# Patient Record
Sex: Male | Born: 1970
Health system: Southern US, Community
[De-identification: ages and names within clinical notes are randomized; demographics above are authoritative.]

## PROBLEM LIST (undated history)

## (undated) DIAGNOSIS — I10 Essential (primary) hypertension: Secondary | ICD-10-CM

## (undated) DIAGNOSIS — T7840XA Allergy, unspecified, initial encounter: Secondary | ICD-10-CM

## (undated) DIAGNOSIS — Z91018 Allergy to other foods: Secondary | ICD-10-CM

## (undated) HISTORY — PX: TONSILLECTOMY: SUR1361

## (undated) HISTORY — DX: Allergy to other foods: Z91.018

## (undated) HISTORY — DX: Allergy, unspecified, initial encounter: T78.40XA

## (undated) HISTORY — PX: TYMPANOSTOMY TUBE PLACEMENT: SHX32

---

## 2002-04-09 ENCOUNTER — Ambulatory Visit (HOSPITAL_COMMUNITY): Admission: RE | Admit: 2002-04-09 | Discharge: 2002-04-09 | Payer: Self-pay | Admitting: Family Medicine

## 2002-04-09 ENCOUNTER — Encounter: Payer: Self-pay | Admitting: Family Medicine

## 2005-11-29 ENCOUNTER — Ambulatory Visit (HOSPITAL_COMMUNITY): Admission: RE | Admit: 2005-11-29 | Discharge: 2005-11-29 | Payer: Self-pay | Admitting: Family Medicine

## 2008-01-04 ENCOUNTER — Ambulatory Visit (HOSPITAL_COMMUNITY): Admission: RE | Admit: 2008-01-04 | Discharge: 2008-01-04 | Payer: Self-pay | Admitting: Family Medicine

## 2008-02-19 ENCOUNTER — Ambulatory Visit (HOSPITAL_COMMUNITY): Admission: RE | Admit: 2008-02-19 | Discharge: 2008-02-19 | Payer: Self-pay | Admitting: Orthopaedic Surgery

## 2012-11-29 ENCOUNTER — Emergency Department (HOSPITAL_COMMUNITY): Payer: Self-pay

## 2012-11-29 ENCOUNTER — Encounter (HOSPITAL_COMMUNITY): Payer: Self-pay | Admitting: *Deleted

## 2012-11-29 ENCOUNTER — Emergency Department (HOSPITAL_COMMUNITY)
Admission: EM | Admit: 2012-11-29 | Discharge: 2012-11-30 | Disposition: A | Discharge status: Home / Self Care | Payer: Self-pay | Attending: Emergency Medicine | Admitting: Emergency Medicine

## 2012-11-29 DIAGNOSIS — Y93G9 Activity, other involving cooking and grilling: Secondary | ICD-10-CM | POA: Insufficient documentation

## 2012-11-29 DIAGNOSIS — IMO0002 Reserved for concepts with insufficient information to code with codable children: Secondary | ICD-10-CM | POA: Insufficient documentation

## 2012-11-29 DIAGNOSIS — Y929 Unspecified place or not applicable: Secondary | ICD-10-CM | POA: Insufficient documentation

## 2012-11-29 DIAGNOSIS — S68620A Partial traumatic transphalangeal amputation of right index finger, initial encounter: Secondary | ICD-10-CM

## 2012-11-29 DIAGNOSIS — W230XXA Caught, crushed, jammed, or pinched between moving objects, initial encounter: Secondary | ICD-10-CM | POA: Insufficient documentation

## 2012-11-29 HISTORY — DX: Essential (primary) hypertension: I10

## 2012-11-29 MED ORDER — LIDOCAINE HCL (PF) 1 % IJ SOLN
5.0000 mL | Freq: Once | INTRAMUSCULAR | Status: AC
  Administered 2012-11-29: 5 mL via INTRADERMAL

## 2012-11-29 MED ORDER — TRAMADOL HCL 50 MG PO TABS
100.0000 mg | ORAL_TABLET | Freq: Once | ORAL | Status: AC
  Administered 2012-11-29: 100 mg via ORAL
  Filled 2012-11-29: qty 2

## 2012-11-29 MED ORDER — CEPHALEXIN 500 MG PO CAPS
1000.0000 mg | ORAL_CAPSULE | Freq: Once | ORAL | Status: AC
Start: 2012-11-29 — End: 2012-11-29
  Administered 2012-11-29: 1000 mg via ORAL
  Filled 2012-11-29: qty 2

## 2012-11-29 MED ORDER — LIDOCAINE HCL (PF) 1 % IJ SOLN
INTRAMUSCULAR | Status: AC
  Administered 2012-11-29: 5 mL via INTRADERMAL
  Filled 2012-11-29: qty 5

## 2012-11-29 MED ORDER — LIDOCAINE HCL (PF) 1 % IJ SOLN
INTRAMUSCULAR | Status: AC
  Filled 2012-11-29: qty 5

## 2012-11-29 NOTE — Consult Note (Signed)
Reason for Consult:Amputation finger Referring Physician: Ekam Besson  Andrew Ferguson is an 41 y.o. male.  HPI: Andrew Ferguson was grinding up deer meat.  Andrew Ferguson accidentally caught his right dominant index finger in the grinder and lost the end of the finger.  The rest of the finger was ground up and not salvageable.  Andrew Ferguson has no other injury.  His wife called me (she is my Print production planner) and I met them here in the ER.  Past Medical History  Diagnosis Date  . Hypertension     History reviewed. No pertinent past surgical history.  No family history on file.  Social History:  does not have a smoking history on file. Andrew Ferguson does not have any smokeless tobacco history on file. His alcohol and drug histories not on file.  Allergies:  Allergies  Allergen Reactions  . Codeine   . Penicillins     Medications: I have reviewed the patient's current medications.  No results found for this or any previous visit (from the past 48 hour(s)).  Dg Finger Index Right  11/29/2012  *RADIOLOGY REPORT*  Clinical Data: Caught finger in grinder.  Amputation to the distal second finger.  RIGHT INDEX FINGER 2+V  Comparison: None.  Findings: There is amputation of the distal right second finger, involving the second middle phalanx, with associated comminuted fragments.  A 4 mm metallic fragment is noted along the dorsal radial soft tissues overlying the second proximal phalanx, 2-3 mm deep to the skin surface.  No additional fractures are identified.  Visualized joint spaces are preserved.  IMPRESSION: Amputation of the distal right second finger, involving the second middle phalanx, with associated comminuted fragments.  4 mm metallic foreign body noted along the dorsal radial soft tissues overlying the second proximal phalanx, 2-3 mm deep to the skin surface.   Original Report Authenticated By: Tonia Ghent, M.D.     Review of Systems  All other systems reviewed and are negative.   Blood pressure 92/53, pulse 69, temperature  97.8 F (36.6 C), temperature source Oral, resp. rate 20, height 5\' 10"  (1.778 m), weight 68.04 kg (150 lb), SpO2 99.00%. Physical Exam  Constitutional: Andrew Ferguson is oriented to person, place, and time. Andrew Ferguson appears well-developed and well-nourished.  HENT:  Head: Normocephalic and atraumatic.  Eyes: Conjunctivae normal and EOM are normal. Pupils are equal, round, and reactive to light.  Neck: Normal range of motion. Neck supple.  Cardiovascular: Normal rate, regular rhythm and intact distal pulses.   Respiratory: Effort normal and breath sounds normal.  GI: Soft. Bowel sounds are normal.  Musculoskeletal: Andrew Ferguson exhibits tenderness (Amputation of the right dominant index finger just distal to the PIP joint.  Andrew Ferguson does have flexion of the PIP.  Ragged wound.  Slight bleeding.  No other injury.).       Right hand: Andrew Ferguson exhibits deformity.       Hands: Neurological: Andrew Ferguson is alert and oriented to person, place, and time. Andrew Ferguson has normal reflexes.  Skin: Skin is warm and dry.  Psychiatric: Andrew Ferguson has a normal mood and affect. His behavior is normal. Judgment and thought content normal.    Assessment/Plan: Amputation, traumatic, of the right dominant index finger just beyond PIP joint.  I cleansed wound.  I did a 1 % Xylocaine block.  Andrew Ferguson had x-rays showing the level of the amputation.  Then Andrew Ferguson was taken to an ER room.  Andrew Ferguson had the end of the finger washed and prepped with Betadine scrub brush.  Then Andrew Ferguson had prep  and drape.  I used bone cutters to trim the middle phalanx and then primarily closed the wound with 3-0 Nylon.  A sterile dressing then a finger dressing was applied.  Andrew Ferguson tolerated the procedure well.  Andrew Ferguson is to keep the finger dry.  Andrew Ferguson is to take home Toradol 50 (2) for pain tonight.  Keflex 500, 2, have been given oral.  Andrew Ferguson will come by office tomorrow for antibiotic samples.  Andrew Ferguson is to call if any problem.  Andrew Ferguson is to be seen in office tomorrow.  Return here tonight if any problem.  Rhen Dossantos 11/29/2012, 11:49  PM

## 2012-11-29 NOTE — ED Notes (Addendum)
Pt grinding meat, severe laceration to index finger right hand.  Finger numbed in triage, sent to X-ray.

## 2012-11-29 NOTE — ED Notes (Signed)
Dr Hilda Lias in room with ortho supplies at this time.

## 2012-11-30 NOTE — ED Notes (Signed)
Pt instructed to follow up at dr Jenetta Downer office in the AM tomorrow for more follow up information and for prescriptions

## 2015-09-10 ENCOUNTER — Emergency Department (HOSPITAL_COMMUNITY)
Admission: EM | Admit: 2015-09-10 | Discharge: 2015-09-10 | Disposition: A | Discharge status: Home / Self Care | Payer: Commercial Indemnity | Attending: Emergency Medicine | Admitting: Emergency Medicine

## 2015-09-10 ENCOUNTER — Encounter (HOSPITAL_COMMUNITY): Payer: Self-pay | Admitting: Emergency Medicine

## 2015-09-10 DIAGNOSIS — Z880 Allergy status to penicillin: Secondary | ICD-10-CM | POA: Diagnosis not present

## 2015-09-10 DIAGNOSIS — Z79899 Other long term (current) drug therapy: Secondary | ICD-10-CM | POA: Insufficient documentation

## 2015-09-10 DIAGNOSIS — L509 Urticaria, unspecified: Secondary | ICD-10-CM

## 2015-09-10 DIAGNOSIS — R21 Rash and other nonspecific skin eruption: Secondary | ICD-10-CM | POA: Diagnosis present

## 2015-09-10 DIAGNOSIS — Z720 Tobacco use: Secondary | ICD-10-CM | POA: Insufficient documentation

## 2015-09-10 DIAGNOSIS — I10 Essential (primary) hypertension: Secondary | ICD-10-CM | POA: Diagnosis not present

## 2015-09-10 DIAGNOSIS — R000 Tachycardia, unspecified: Secondary | ICD-10-CM | POA: Diagnosis not present

## 2015-09-10 MED ORDER — PREDNISONE 20 MG PO TABS: 60.0000 mg | ORAL_TABLET | Freq: Every day | ORAL | Status: DC

## 2015-09-10 MED ORDER — FAMOTIDINE IN NACL 20-0.9 MG/50ML-% IV SOLN
20.0000 mg | Freq: Once | INTRAVENOUS | Status: AC
  Administered 2015-09-10: 20 mg via INTRAVENOUS
  Filled 2015-09-10: qty 50

## 2015-09-10 MED ORDER — FAMOTIDINE 20 MG PO TABS: 20.0000 mg | ORAL_TABLET | Freq: Two times a day (BID) | ORAL | Status: DC

## 2015-09-10 MED ORDER — EPINEPHRINE 0.3 MG/0.3ML IJ SOAJ: 0.3000 mg | Freq: Once | INTRAMUSCULAR | Status: DC

## 2015-09-10 MED ORDER — METHYLPREDNISOLONE SODIUM SUCC 125 MG IJ SOLR
125.0000 mg | Freq: Once | INTRAMUSCULAR | Status: AC
  Administered 2015-09-10: 125 mg via INTRAVENOUS
  Filled 2015-09-10: qty 2

## 2015-09-10 MED ORDER — DIPHENHYDRAMINE HCL 50 MG/ML IJ SOLN: 25.0000 mg | Freq: Once | INTRAMUSCULAR | Status: DC

## 2015-09-10 NOTE — ED Provider Notes (Signed)
TIME SEEN: 2:50 AM  CHIEF COMPLAINT: Rash  HPI: Pt is a 44 y.o. male with history of hypertension who presents emergency department with diffuse urticaria that started 30 minutes prior to arrival. Reports symptoms started 30 minutes after eating an apple pie with "seeds in it". Denies any stress of breath or wheezing. No lip or tongue swelling. No new soaps, lotions, medications, detergents or other new exposures. Take 50 mg of Benadryl prior to arrival and states he has significantly improved.  ROS: See HPI Constitutional: no fever  Eyes: no drainage  ENT: no runny nose   Cardiovascular:  no chest pain  Resp: no SOB  GI: no vomiting GU: no dysuria Integumentary:  rash  Allergy:  hives  Musculoskeletal: no leg swelling  Neurological: no slurred speech ROS otherwise negative  PAST MEDICAL HISTORY/PAST SURGICAL HISTORY:  Past Medical History  Diagnosis Date  . Hypertension     MEDICATIONS:  Prior to Admission medications   Medication Sig Start Date End Date Taking? Authorizing Provider  lisinopril (PRINIVIL,ZESTRIL) 10 MG tablet Take 10 mg by mouth daily.    Historical Provider, MD    ALLERGIES:  Allergies  Allergen Reactions  . Codeine   . Penicillins     SOCIAL HISTORY:  Social History  Substance Use Topics  . Smoking status: Current Every Day Smoker    Types: Cigarettes  . Smokeless tobacco: Not on file  . Alcohol Use: Yes    FAMILY HISTORY: History reviewed. No pertinent family history.  EXAM: BP 121/92 mmHg  Pulse 120  Temp(Src) 98.2 F (36.8 C)  Resp 18  Ht 5\' 11"  (1.803 m)  Wt 155 lb (70.308 kg)  BMI 21.63 kg/m2  SpO2 99% CONSTITUTIONAL: Alert and oriented and responds appropriately to questions. Well-appearing; well-nourished HEAD: Normocephalic EYES: Conjunctivae clear, PERRL ENT: normal nose; no rhinorrhea; moist mucous membranes; pharynx without lesions noted, no stridor, normal phonation, no angioedema, posterior oropharynx is widely patent,  no Ludwig's angina, tongue sits flat in the bottom of the mouth NECK: Supple, no meningismus, no LAD  CARD: Regular and tachycardic; S1 and S2 appreciated; no murmurs, no clicks, no rubs, no gallops RESP: Normal chest excursion without splinting or tachypnea; breath sounds clear and equal bilaterally; no wheezes, no rhonchi, no rales, no hypoxia or respiratory distress, speaking full sentences ABD/GI: Normal bowel sounds; non-distended; soft, non-tender, no rebound, no guarding, no peritoneal signs BACK:  The back appears normal and is non-tender to palpation, there is no CVA tenderness EXT: Normal ROM in all joints; non-tender to palpation; no edema; normal capillary refill; no cyanosis, no calf tenderness or swelling    SKIN: Normal color for age and race; warm, patient skin appears very red but no urticaria currently, no petechiae or purpura, no blisters or desquamation, no rash involving the palms, soles or mucous membranes NEURO: Moves all extremities equally, sensation to light touch intact diffusely, cranial nerves II through XII intact PSYCH: The patient's mood and manner are appropriate. Grooming and personal hygiene are appropriate.  MEDICAL DECISION MAKING: Patient here with possible allergic reaction. Describes a rash consistent with urticaria that has improved with Benadryl. Currently he has redness all over his skin but no specific rash. No angioedema. No wheezing. No hypotension. Will give Solu-Medrol, Pepcid and continue to monitor patient.  ED PROGRESS: 4:10 AM  Pt's rash is completely resolved. We'll discharge with prednisone, Pepcid. Have advised him to continue Benadryl. His heart rate has improved as well. Have advised him to avoid eating  this apple pie again. Recommended follow-up with his PCP as he may need referral to an allergy specialist. Will also discharge with prescription for a drain in clinic in case he has worsening symptoms in the future. Discussed when to use  epinephrine. Discussed return precautions. Patient and his wife at bedside verbalize understanding and are comfortable with this plan.     Layla Maw Lissete Maestas, DO 09/10/15 [phone]

## 2015-09-10 NOTE — Discharge Instructions (Signed)
Hives Hives are itchy, red, swollen areas of the skin. They can vary in size and location on your body. Hives can come and go for hours or several days (acute hives) or for several weeks (chronic hives). Hives do not spread from person to person (noncontagious). They may get worse with scratching, exercise, and emotional stress. CAUSES   Allergic reaction to food, additives, or drugs.  Infections, including the common cold.  Illness, such as vasculitis, lupus, or thyroid disease.  Exposure to sunlight, heat, or cold.  Exercise.  Stress.  Contact with chemicals. SYMPTOMS   Red or white swollen patches on the skin. The patches may change size, shape, and location quickly and repeatedly.  Itching.  Swelling of the hands, feet, and face. This may occur if hives develop deeper in the skin. DIAGNOSIS  Your caregiver can usually tell what is wrong by performing a physical exam. Skin or blood tests may also be done to determine the cause of your hives. In some cases, the cause cannot be determined. TREATMENT  Mild cases usually get better with medicines such as antihistamines. Severe cases may require an emergency epinephrine injection. If the cause of your hives is known, treatment includes avoiding that trigger.  HOME CARE INSTRUCTIONS   Avoid causes that trigger your hives.  Take antihistamines as directed by your caregiver to reduce the severity of your hives. Non-sedating or low-sedating antihistamines are usually recommended. Do not drive while taking an antihistamine.  Take any other medicines prescribed for itching as directed by your caregiver.  Wear loose-fitting clothing.  Keep all follow-up appointments as directed by your caregiver. SEEK MEDICAL CARE IF:   You have persistent or severe itching that is not relieved with medicine.  You have painful or swollen joints. SEEK IMMEDIATE MEDICAL CARE IF:   You have a fever.  Your tongue or lips are swollen.  You have  trouble breathing or swallowing.  You feel tightness in the throat or chest.  You have abdominal pain. These problems may be the first sign of a life-threatening allergic reaction. Call your local emergency services (911 in [place] MAKE SURE YOU:   Understand these instructions.  Will watch your condition.  Will get help right away if you are not doing well or get worse. Document Released: 12/13/2005 Document Revised: 12/18/2013 Document Reviewed: 03/07/2012 ExitCare Patient Information 2015 [employer]. This information is not intended to replace advice given to you by your health care provider. Make sure you discuss any questions you have with your health care provider.  

## 2015-09-10 NOTE — ED Notes (Signed)
Pt c/o skin redness and rash x .

## 2016-09-08 DIAGNOSIS — I10 Essential (primary) hypertension: Secondary | ICD-10-CM | POA: Diagnosis not present

## 2016-09-10 DIAGNOSIS — Z720 Tobacco use: Secondary | ICD-10-CM | POA: Diagnosis not present

## 2016-09-10 DIAGNOSIS — I10 Essential (primary) hypertension: Secondary | ICD-10-CM | POA: Diagnosis not present

## 2017-03-09 DIAGNOSIS — I10 Essential (primary) hypertension: Secondary | ICD-10-CM | POA: Diagnosis not present

## 2017-03-11 DIAGNOSIS — Z0001 Encounter for general adult medical examination with abnormal findings: Secondary | ICD-10-CM | POA: Diagnosis not present

## 2017-03-11 DIAGNOSIS — I10 Essential (primary) hypertension: Secondary | ICD-10-CM | POA: Diagnosis not present

## 2017-03-11 DIAGNOSIS — M25511 Pain in right shoulder: Secondary | ICD-10-CM | POA: Diagnosis not present

## 2017-05-30 ENCOUNTER — Encounter: Payer: Self-pay | Admitting: Medical

## 2017-05-30 ENCOUNTER — Ambulatory Visit: Payer: Self-pay | Admitting: Medical

## 2017-05-30 VITALS — BP 126/78 | HR 71 | Temp 98.0°F | Resp 16

## 2017-05-30 DIAGNOSIS — S60459A Superficial foreign body of unspecified finger, initial encounter: Secondary | ICD-10-CM

## 2017-05-30 NOTE — Progress Notes (Signed)
   Subjective:    Patient ID: Andrew Ferguson, male    DOB: March 08, 1971, 46 y.o.   MRN: [id number]  HPI  46 yo male comes in today with  5 day history of piece of metal in 4th right finger on palm side. 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 Last Tetanus vaccine  2 years ago.    Review of Systems  Constitutional: Negative for chills and fever.  HENT: Negative for congestion, ear pain and sore throat.   Eyes: Negative for discharge and itching.  Respiratory: Negative for cough and shortness of breath.   Cardiovascular: Negative for chest pain.  Gastrointestinal: Negative for diarrhea, nausea and vomiting.  Endocrine: Negative for polydipsia, polyphagia and polyuria.  Genitourinary: Negative for hematuria.  Musculoskeletal: Negative for myalgias.  Skin: Negative for rash and wound.  Neurological: Negative for dizziness and syncope.       Objective:   Physical Exam  Constitutional: He is oriented to person, place, and time. He appears well-developed and well-nourished.  HENT:  Head: Normocephalic and atraumatic.  Eyes: EOM are normal. Pupils are equal, round, and reactive to light.  Neck: Normal range of motion.  Neurological: He is alert and oriented to person, place, and time.  Skin: Skin is warm and dry.  Psychiatric: He has a normal mood and affect. His behavior is normal.  Nursing note and vitals reviewed.   4th right finger with small metal piece on base of finger medially.      Assessment & Plan:  Metal in finger, used alcohol swabs , needle and forceps, removed without difficulty , patient tolerated procedure well. Triple antibiotic ointment and bandage to area. Reviewed wound are and signs and symptoms of infection,  To return to the clinic as needed.

## 2017-11-14 ENCOUNTER — Ambulatory Visit: Payer: Self-pay | Admitting: Medical

## 2017-11-14 ENCOUNTER — Encounter: Payer: Self-pay | Admitting: Medical

## 2017-11-14 VITALS — BP 126/92 | HR 76 | Temp 97.5°F | Wt 163.6 lb

## 2017-11-14 DIAGNOSIS — I10 Essential (primary) hypertension: Secondary | ICD-10-CM

## 2017-11-14 NOTE — Progress Notes (Signed)
   Subjective:    Patient ID: Andrew Ferguson, male    DOB: 1971/05/01, 46 y.o.   MRN: [id number]  HPI   46 yo male in non acute distess, here for blood pressure check , not sure if he took it last night. Has a machine at home and checks it himself,usually runs  120's /86 sees Dr. Evette DoffingZac Ferguson for his hypertension.  Review of Systems  Constitutional: Positive for chills. Negative for fever.  HENT: Positive for congestion (seasonal congestion). Negative for sore throat.   Eyes: Negative for discharge and itching.  Respiratory: Negative for cough, chest tightness and shortness of breath.   Cardiovascular: Negative for chest pain, palpitations and leg swelling.  Gastrointestinal: Negative for abdominal pain.  Endocrine: Negative for polydipsia, polyphagia and polyuria.  Genitourinary: Negative for dysuria.  Musculoskeletal: Positive for back pain (this weekend "went out" and better today, chronic condition.).  Skin: Negative for rash.  Allergic/Immunologic: Negative for environmental allergies, food allergies and immunocompromised state.  Neurological: Negative for dizziness, syncope and light-headedness.  Hematological: Negative for adenopathy.  Psychiatric/Behavioral: Negative for behavioral problems, self-injury, sleep disturbance and suicidal ideas. The patient is not nervous/anxious.        Objective:   Physical Exam  Constitutional: He is oriented to person, place, and time. He appears well-developed and well-nourished.  HENT:  Head: Normocephalic and atraumatic.  Eyes: Conjunctivae and EOM are normal. Pupils are equal, round, and reactive to light.  Neck: Normal range of motion. Neck supple.  Cardiovascular: Normal rate, regular rhythm and normal heart sounds. Exam reveals no gallop and no friction rub.  No murmur heard. Pulmonary/Chest: Effort normal and breath sounds normal.  Neurological: He is alert and oriented to person, place, and time.  Skin: Skin is warm and dry.   Psychiatric: He has a normal mood and affect. His behavior is normal. Judgment and thought content normal.  Nursing note and vitals reviewed.         Assessment & Plan:  Hypertension. Follow up with your doctor as scheduled. Reviewed with patient 2 ways to remember to take his medication.  One is to use a Monday-Sunday pill box or  Another is to set an alarm on his  phone.  He verbalizes understanding and had no questions at discharge.

## 2017-11-14 NOTE — Patient Instructions (Signed)
Return to the  clinic as needed.   Hypertension Hypertension is another name for high blood pressure. High blood pressure forces your heart to work harder to pump blood. This can cause problems over time. There are two numbers in a blood pressure reading. There is a top number (systolic) over a bottom number (diastolic). It is best to have a blood pressure below 120/80. Healthy choices can help lower your blood pressure. You may need medicine to help lower your blood pressure if:  Your blood pressure cannot be lowered with healthy choices.  Your blood pressure is higher than 130/80.  Follow these instructions at home: Eating and drinking  If directed, follow the DASH eating plan. This diet includes: ? Filling half of your plate at each meal with fruits and vegetables. ? Filling one quarter of your plate at each meal with whole grains. Whole grains include whole wheat pasta, brown rice, and whole grain bread. ? Eating or drinking low-fat dairy products, such as skim milk or low-fat yogurt. ? Filling one quarter of your plate at each meal with low-fat (lean) proteins. Low-fat proteins include fish, skinless chicken, eggs, beans, and tofu. ? Avoiding fatty meat, cured and processed meat, or chicken with skin. ? Avoiding premade or processed food.  Eat less than 1,500 mg of salt (sodium) a day.  Limit alcohol use to no more than 1 drink a day for nonpregnant women and 2 drinks a day for men. One drink equals 12 oz of beer, 5 oz of wine, or 1 oz of hard liquor. Lifestyle  Work with your doctor to stay at a healthy weight or to lose weight. Ask your doctor what the best weight is for you.  Get at least 30 minutes of exercise that causes your heart to beat faster (aerobic exercise) most days of the week. This may include walking, swimming, or biking.  Get at least 30 minutes of exercise that strengthens your muscles (resistance exercise) at least 3 days a week. This may include lifting  weights or pilates.  Do not use any products that contain nicotine or tobacco. This includes cigarettes and e-cigarettes. If you need help quitting, ask your doctor.  Check your blood pressure at home as told by your doctor.  Keep all follow-up visits as told by your doctor. This is important. Medicines  Take over-the-counter and prescription medicines only as told by your doctor. Follow directions carefully.  Do not skip doses of blood pressure medicine. The medicine does not work as well if you skip doses. Skipping doses also puts you at risk for problems.  Ask your doctor about side effects or reactions to medicines that you should watch for. Contact a doctor if:  You think you are having a reaction to the medicine you are taking.  You have headaches that keep coming back (recurring).  You feel dizzy.  You have swelling in your ankles.  You have trouble with your vision. Get help right away if:  You get a very bad headache.  You start to feel confused.  You feel weak or numb.  You feel faint.  You get very bad pain in your: ? Chest. ? Belly (abdomen).  You throw up (vomit) more than once.  You have trouble breathing. Summary  Hypertension is another name for high blood pressure.  Making healthy choices can help lower blood pressure. If your blood pressure cannot be controlled with healthy choices, you may need to take medicine. This information is not intended to  replace advice given to you by your health care provider. Make sure you discuss any questions you have with your health care provider. Document Released: 05/31/2008 Document Revised: 11/10/2016 Document Reviewed: 11/10/2016 Elsevier Interactive Patient Education  [employer].

## 2018-03-09 DIAGNOSIS — Z720 Tobacco use: Secondary | ICD-10-CM | POA: Diagnosis not present

## 2018-03-09 DIAGNOSIS — I10 Essential (primary) hypertension: Secondary | ICD-10-CM | POA: Diagnosis not present

## 2018-03-13 DIAGNOSIS — I10 Essential (primary) hypertension: Secondary | ICD-10-CM | POA: Diagnosis not present

## 2018-03-13 DIAGNOSIS — Z0000 Encounter for general adult medical examination without abnormal findings: Secondary | ICD-10-CM | POA: Diagnosis not present

## 2019-04-11 DIAGNOSIS — I10 Essential (primary) hypertension: Secondary | ICD-10-CM | POA: Diagnosis not present

## 2019-04-16 DIAGNOSIS — R945 Abnormal results of liver function studies: Secondary | ICD-10-CM | POA: Diagnosis not present

## 2019-04-16 DIAGNOSIS — I10 Essential (primary) hypertension: Secondary | ICD-10-CM | POA: Diagnosis not present

## 2019-04-16 DIAGNOSIS — R7301 Impaired fasting glucose: Secondary | ICD-10-CM | POA: Diagnosis not present

## 2019-04-16 DIAGNOSIS — F17210 Nicotine dependence, cigarettes, uncomplicated: Secondary | ICD-10-CM | POA: Diagnosis not present

## 2019-04-18 ENCOUNTER — Encounter: Payer: Self-pay | Admitting: Internal Medicine

## 2019-06-05 DIAGNOSIS — J301 Allergic rhinitis due to pollen: Secondary | ICD-10-CM | POA: Diagnosis not present

## 2019-06-05 DIAGNOSIS — R7301 Impaired fasting glucose: Secondary | ICD-10-CM | POA: Diagnosis not present

## 2019-06-05 DIAGNOSIS — Z720 Tobacco use: Secondary | ICD-10-CM | POA: Diagnosis not present

## 2019-06-05 DIAGNOSIS — R945 Abnormal results of liver function studies: Secondary | ICD-10-CM | POA: Diagnosis not present

## 2019-06-05 DIAGNOSIS — I10 Essential (primary) hypertension: Secondary | ICD-10-CM | POA: Diagnosis not present

## 2019-07-02 DIAGNOSIS — T675XXA Heat exhaustion, unspecified, initial encounter: Secondary | ICD-10-CM | POA: Diagnosis not present

## 2019-07-16 ENCOUNTER — Ambulatory Visit (INDEPENDENT_AMBULATORY_CARE_PROVIDER_SITE_OTHER): Payer: Self-pay | Admitting: *Deleted

## 2019-07-16 ENCOUNTER — Telehealth: Payer: Self-pay | Admitting: *Deleted

## 2019-07-16 ENCOUNTER — Other Ambulatory Visit: Payer: Self-pay

## 2019-07-16 DIAGNOSIS — Z1211 Encounter for screening for malignant neoplasm of colon: Secondary | ICD-10-CM

## 2019-07-16 MED ORDER — PEG 3350-KCL-NA BICARB-NACL 420 G PO SOLR: 4000.0000 mL | Freq: Once | ORAL | 0 refills | Status: AC

## 2019-07-16 NOTE — Progress Notes (Signed)
Gastroenterology Pre-Procedure Review  Request Date: 07/16/2019 Requesting Physician: Dr. Wende Neighbors, No previous TCS  PATIENT REVIEW QUESTIONS: The patient responded to the following health history questions as indicated:    1. Diabetes Melitis: No 2. Joint replacements in the past 12 months: No 3. Major health problems in the past 3 months: No 4. Has an artificial valve or MVP: No 5. Has a defibrillator: No 6. Has been advised in past to take antibiotics in advance of a procedure like teeth cleaning: No 7. Family history of colon cancer: No  8. Alcohol Use: Yes, a couple of beers a day 9. History of sleep apnea: No  10. History of coronary artery or other vascular stents placed within the last 12 months: No 11. History of any prior anesthesia complications: No    MEDICATIONS & ALLERGIES:    Patient reports the following regarding taking any blood thinners:   Plavix? No Aspirin? No Coumadin? No Brilinta? No Xarelto? No Eliquis? No Pradaxa? No Savaysa? No Effient? No  Patient confirms/reports the following medications:  Current Outpatient Medications  Medication Sig Dispense Refill  . lisinopril (PRINIVIL,ZESTRIL) 10 MG tablet Take 10 mg by mouth daily.     No current facility-administered medications for this visit.     Patient confirms/reports the following allergies:  Allergies  Allergen Reactions  . Codeine   . Penicillins     No orders of the defined types were placed in this encounter.   AUTHORIZATION INFORMATION Primary Insurance: [place],  [place] #: [id number],  Group #: [id number] Pre-Cert / Auth required: None required  SCHEDULE INFORMATION: Procedure has been scheduled as follows:  Date:08/15/2019, Time: 8:30 Location: [facility] with Dr. Gala Romney  This Gastroenterology Pre-Precedure Review Form is being routed to the following provider(s): Walden Field, NP

## 2019-07-16 NOTE — Telephone Encounter (Signed)
Completed nurse triage visit by phone.  Instructions, pre-procedure acknowledgments, and COVID screening were discussed.  Pt voiced understanding.  Pt is aware that we will mail out all information discussed by phone.

## 2019-07-16 NOTE — Patient Instructions (Signed)
Andrew Ferguson   05-17-1971 MRN: [id number]    Procedure Date:  08/15/2019 Time to register: 7:30 am Place to register: [facility] Procedure Time: 8:30 am Scheduled provider: Dr. Gala Romney  PREPARATION FOR COLONOSCOPY WITH TRI-LYTE SPLIT PREP  Please notify us immediately if you are diabetic, take iron supplements, or if you are on Coumadin or any other blood thinners.    You will need to purchase 1 fleet enema and 1 box of Bisacodyl 42m tablets.   2 DAYS BEFORE PROCEDURE:  DATE: 08/13/2019   DAY: Monday Begin clear liquid diet AFTER your lunch meal. NO SOLID FOODS after this point.  1 DAY BEFORE PROCEDURE:  DATE: 08/14/2019   DAY: Tuesday Continue clear liquids the entire day - NO SOLID FOOD.    At 2:00 pm:  Take 2 Bisacodyl tablets.   At 4:00pm:  Start drinking your solution. Make sure you mix well per instructions on the bottle. Try to drink 1 (one) 8 ounce glass every 10-15 minutes until you have consumed HALF the jug. You should complete by 6:00pm.You must keep the left over solution refrigerated until completed next day.  Continue clear liquids. You must drink plenty of clear liquids to prevent dehyration and kidney failure.     DAY OF PROCEDURE:   DATE: 08/15/2019   DAY: Wednesday If you take medications for your heart, blood pressure or breathing, you may take these medications.    Five hours before your procedure time @ 3:30am:  Finish remaining amout of bowel prep, drinking 1 (one) 8 ounce glass every 10-15 minutes until complete. You have two hours to consume remaining prep.   Three hours before your procedure time _0 :30am:  Nothing by mouth.   At least one hour before going to the hospital:  Give yourself one Fleet enema. You may take your morning medications with sip of water unless we have instructed otherwise.      Please see below for Dietary Information.  CLEAR LIQUIDS INCLUDE:  Water Jello (NOT red in color)   Ice Popsicles (NOT red in color)    Tea (sugar ok, no milk/cream) Powdered fruit flavored drinks  Coffee (sugar ok, no milk/cream) Gatorade/ Lemonade/ Kool-Aid  (NOT red in color)   Juice: apple, white grape, white cranberry Soft drinks  Clear bullion, consomme, broth (fat free beef/chicken/vegetable)  Carbonated beverages (any kind)  Strained chicken noodle soup Hard Candy   Remember: Clear liquids are liquids that will allow you to see your fingers on the other side of a clear glass. Be sure liquids are NOT red in color, and not cloudy, but CLEAR.  DO NOT EAT OR DRINK ANY OF THE FOLLOWING:  Dairy products of any kind   Cranberry juice Tomato juice / V8 juice   Grapefruit juice Orange juice     Red grape juice  Do not eat any solid foods, including such foods as: cereal, oatmeal, yogurt, fruits, vegetables, creamed soups, eggs, bread, crackers, pureed foods in a blender, etc.   HELPFUL HINTS FOR DRINKING PREP SOLUTION:   Make sure prep is extremely cold. Mix and refrigerate the the morning of the prep. You may also put in the freezer.   You may try mixing some Crystal Light or Country Time Lemonade if you prefer. Mix in small amounts; add more if necessary.  Try drinking through a straw  Rinse mouth with water or a mouthwash between glasses, to remove after-taste.  Try sipping on a cold beverage /ice/ popsicles between glasses  of prep.  Place a piece of sugar-free hard candy in mouth between glasses.  If you become nauseated, try consuming smaller amounts, or stretch out the time between glasses. Stop for 30-60 minutes, then slowly start back drinking.        OTHER INSTRUCTIONS  You will need a responsible adult at least 50 years of age to accompany you and drive you home. This person must remain in the waiting room during your procedure. The hospital will cancel your procedure if you do not have a responsible adult with you.   1. Wear loose fitting clothing that is easily removed. 2. Leave jewelry and  other valuables at home.  3. Remove all body piercing jewelry and leave at home. 4. Total time from sign-in until discharge is approximately 2-3 hours. 5. You should go home directly after your procedure and rest. You can resume normal activities the day after your procedure. 6. The day of your procedure you should not:  Drive  Make legal decisions  Operate machinery  Drink alcohol  Return to work   You may call the office (Dept: [phone]) before 5:00pm, or page the doctor on call [phone]) after 5:00pm, for further instructions, if necessary.   Insurance Information YOU WILL NEED TO CHECK WITH YOUR INSURANCE COMPANY FOR THE BENEFITS OF COVERAGE YOU HAVE FOR THIS PROCEDURE.  UNFORTUNATELY, NOT ALL INSURANCE COMPANIES HAVE BENEFITS TO COVER ALL OR PART OF THESE TYPES OF PROCEDURES.  IT IS YOUR RESPONSIBILITY TO CHECK YOUR BENEFITS, HOWEVER, WE WILL BE GLAD TO ASSIST YOU WITH ANY CODES YOUR INSURANCE COMPANY MAY NEED.    PLEASE NOTE THAT MOST INSURANCE COMPANIES WILL NOT COVER A SCREENING COLONOSCOPY FOR PEOPLE UNDER THE AGE OF 50  IF YOU HAVE BCBS INSURANCE, YOU MAY HAVE BENEFITS FOR A SCREENING COLONOSCOPY BUT IF POLYPS ARE FOUND THE DIAGNOSIS WILL CHANGE AND THEN YOU MAY HAVE A DEDUCTIBLE THAT WILL NEED TO BE MET. SO PLEASE MAKE SURE YOU CHECK YOUR BENEFITS FOR A SCREENING COLONOSCOPY AS WELL AS A DIAGNOSTIC COLONOSCOPY.

## 2019-07-18 NOTE — Progress Notes (Signed)
Will likely need OV for propofol/MAC consideration due to ongoing daily ETOH

## 2019-07-19 ENCOUNTER — Encounter: Payer: Self-pay | Admitting: *Deleted

## 2019-07-19 NOTE — Progress Notes (Signed)
Mailed letter to pt informing him that he needed an ov after provider review.  Also, included info that his current procedure and COVID screening would be cancelled.

## 2019-08-13 ENCOUNTER — Other Ambulatory Visit (HOSPITAL_COMMUNITY): Payer: Commercial Indemnity

## 2019-08-29 ENCOUNTER — Ambulatory Visit: Payer: BC Managed Care – PPO | Admitting: Gastroenterology

## 2019-09-11 ENCOUNTER — Ambulatory Visit: Payer: BC Managed Care – PPO

## 2019-09-11 ENCOUNTER — Other Ambulatory Visit: Payer: Self-pay

## 2019-09-11 ENCOUNTER — Encounter (INDEPENDENT_AMBULATORY_CARE_PROVIDER_SITE_OTHER): Payer: Self-pay

## 2019-09-11 ENCOUNTER — Ambulatory Visit (INDEPENDENT_AMBULATORY_CARE_PROVIDER_SITE_OTHER): Payer: BC Managed Care – PPO | Admitting: Orthopaedic Surgery

## 2019-09-11 ENCOUNTER — Encounter: Payer: Self-pay | Admitting: Orthopaedic Surgery

## 2019-09-11 VITALS — BP 130/93 | HR 68 | Ht 70.0 in | Wt 149.0 lb

## 2019-09-11 DIAGNOSIS — S63287A Dislocation of proximal interphalangeal joint of left little finger, initial encounter: Secondary | ICD-10-CM | POA: Diagnosis not present

## 2019-09-11 DIAGNOSIS — M79645 Pain in left finger(s): Secondary | ICD-10-CM

## 2019-09-11 NOTE — Progress Notes (Signed)
Subjective:    Patient ID: Andrew Ferguson, male    DOB: [birth year], 48 y.o.   MRN: [id number]  HPI He was getting off a boat and his little finger got caught and twisted. It hurt.  He has deformity.  This happened Friday, September 11.  He has had pain and the deformity.  He has no other injury.   Review of Systems  Constitutional: Positive for activity change.  Musculoskeletal: Positive for arthralgias.  All other systems reviewed and are negative.  For Review of Systems, all other systems reviewed and are negative.  The following is a summary of the past history medically, past history surgically, known current medicines, social history and family history.  This information is gathered electronically by the computer from prior information and documentation.  I review this each visit and have found including this information at this point in the chart is beneficial and informative.   Past Medical History:  Diagnosis Date  . Hypertension     History reviewed. No pertinent surgical history.  Current Outpatient Medications on File Prior to Visit  Medication Sig Dispense Refill  . lisinopril (PRINIVIL,ZESTRIL) 10 MG tablet Take 10 mg by mouth daily.     No current facility-administered medications on file prior to visit.     Social History   Socioeconomic History  . Marital status: Married    Spouse name: Not on file  . Number of children: Not on file  . Years of education: Not on file  . Highest education level: Not on file  Occupational History  . Not on file  Social Needs  . Financial resource strain: Not on file  . Food insecurity    Worry: Not on file    Inability: Not on file  . Transportation needs    Medical: Not on file    Non-medical: Not on file  Tobacco Use  . Smoking status: Current Every Day Smoker    Types: Cigarettes  . Smokeless tobacco: Current User    Types: Chew  . Tobacco comment: 3 cigarettes per day  Substance and Sexual Activity  .  Alcohol use: Yes  . Drug use: No  . Sexual activity: Not on file  Lifestyle  . Physical activity    Days per week: Not on file    Minutes per session: Not on file  . Stress: Not on file  Relationships  . Social Herbalist on phone: Not on file    Gets together: Not on file    Attends religious service: Not on file    Active member of club or organization: Not on file    Attends meetings of clubs or organizations: Not on file    Relationship status: Not on file  . Intimate partner violence    Fear of current or ex partner: Not on file    Emotionally abused: Not on file    Physically abused: Not on file    Forced sexual activity: Not on file  Other Topics Concern  . Not on file  Social History Narrative  . Not on file    Family History  Problem Relation Age of Onset  . Healthy Mother   . Healthy Father     BP (!) 130/93   Pulse 68   Ht 5\' 10"  (1.778 m)   Wt 149 lb (67.6 kg)   BMI 21.38 kg/m   Body mass index is 21.38 kg/m.     Objective:   Physical Exam  Vitals signs reviewed.  Constitutional:      Appearance: He is well-developed.  HENT:     Head: Normocephalic and atraumatic.  Eyes:     Conjunctiva/sclera: Conjunctivae normal.     Pupils: Pupils are equal, round, and reactive to light.  Neck:     Musculoskeletal: Normal range of motion and neck supple.  Cardiovascular:     Rate and Rhythm: Normal rate and regular rhythm.  Pulmonary:     Effort: Pulmonary effort is normal.  Abdominal:     Palpations: Abdomen is soft.  Musculoskeletal:     Left hand: He exhibits decreased range of motion and deformity.       Hands:  Skin:    General: Skin is warm and dry.  Neurological:     Mental Status: He is alert and oriented to person, place, and time.     Cranial Nerves: No cranial nerve deficit.     Motor: No abnormal muscle tone.     Coordination: Coordination normal.     Deep Tendon Reflexes: Reflexes are normal and symmetric. Reflexes normal.   Psychiatric:        Behavior: Behavior normal.        Thought Content: Thought content normal.        Judgment: Judgment normal.   X-rays of the left little finger were done, reported separately.  Post reduction was done of the little finger on the right, reported separately.      Assessment & Plan:   Encounter Diagnoses  Name Primary?  . Pain in left finger(s) Yes  . Dislocation of proximal interphalangeal joint of left little finger, initial encounter    Procedure note: After permission from the patient, I did a digital block of the left little finger by sterile technique tolerated well.  Closed reduction carried out.  Dorsal splint applied.  Post reduction x-rays were done.  Return in two day.  X-rays then.  Call if any problem.  Precautions discussed.   Electronically Signed Darreld McleanWayne Vidhi Delellis, MD 9/15/202010:19 AM

## 2019-09-13 ENCOUNTER — Ambulatory Visit (INDEPENDENT_AMBULATORY_CARE_PROVIDER_SITE_OTHER): Payer: BC Managed Care – PPO | Admitting: Orthopaedic Surgery

## 2019-09-13 ENCOUNTER — Ambulatory Visit: Payer: BC Managed Care – PPO

## 2019-09-13 ENCOUNTER — Encounter: Payer: Self-pay | Admitting: Orthopaedic Surgery

## 2019-09-13 ENCOUNTER — Other Ambulatory Visit: Payer: Self-pay

## 2019-09-13 VITALS — BP 158/102 | HR 62 | Temp 97.7°F | Wt 154.0 lb

## 2019-09-13 DIAGNOSIS — M79645 Pain in left finger(s): Secondary | ICD-10-CM | POA: Diagnosis not present

## 2019-09-13 NOTE — Progress Notes (Signed)
CC:  My finger ismuch better  He has no pain of the left little finger.  NV intact.  ROM is good but not full.  X-rays were done, reported separately, of the left little finger.  Encounter Diagnosis  Name Primary?  . Pain in left finger(s) Yes   Return in three weeks.  Buddy tape finger to the ring finger with gauze or cotton between.  X-rays on return.  Call if any problem.  Precautions discussed.   Electronically Signed Sanjuana Kava, MD 9/17/20208:38 AM

## 2019-10-04 ENCOUNTER — Ambulatory Visit: Payer: BC Managed Care – PPO | Admitting: Orthopaedic Surgery

## 2019-10-11 ENCOUNTER — Other Ambulatory Visit: Payer: Self-pay

## 2019-10-11 ENCOUNTER — Ambulatory Visit: Payer: BC Managed Care – PPO

## 2019-10-11 ENCOUNTER — Encounter: Payer: Self-pay | Admitting: Orthopaedic Surgery

## 2019-10-11 ENCOUNTER — Ambulatory Visit (INDEPENDENT_AMBULATORY_CARE_PROVIDER_SITE_OTHER): Payer: BC Managed Care – PPO | Admitting: Orthopaedic Surgery

## 2019-10-11 VITALS — BP 134/83 | HR 75 | Ht 70.0 in | Wt 154.0 lb

## 2019-10-11 DIAGNOSIS — S63287D Dislocation of proximal interphalangeal joint of left little finger, subsequent encounter: Secondary | ICD-10-CM

## 2019-10-11 NOTE — Progress Notes (Signed)
Patient Andrew Ferguson, male DOB:08/18/1971, 48 y.o. BJY:[id number]  Chief Complaint  Patient presents with  . Finger Injury    09/07/2019 dislocation left 5th finger     HPI  Andrew Ferguson is a 48 y.o. male who had dislocation of the left little finger PIP joint a few weeks ago.  He has done well since relocation of the finger.  He has no pain or swelling or redness.  He has returned to work regular duty.   Body mass index is 22.1 kg/m.  ROS  Review of Systems  Constitutional: Positive for activity change.  Musculoskeletal: Positive for arthralgias.  All other systems reviewed and are negative.   All other systems reviewed and are negative.  The following is a summary of the past history medically, past history surgically, known current medicines, social history and family history.  This information is gathered electronically by the computer from prior information and documentation.  I review this each visit and have found including this information at this point in the chart is beneficial and informative.    Past Medical History:  Diagnosis Date  . Hypertension     History reviewed. No pertinent surgical history.  Family History  Problem Relation Age of Onset  . Healthy Mother   . Healthy Father     Social History Social History   Tobacco Use  . Smoking status: Current Every Day Smoker    Types: Cigarettes  . Smokeless tobacco: Current User    Types: Chew  . Tobacco comment: 3 cigarettes per day  Substance Use Topics  . Alcohol use: Yes  . Drug use: No    Allergies  Allergen Reactions  . Codeine   . Penicillins     Current Outpatient Medications  Medication Sig Dispense Refill  . lisinopril (PRINIVIL,ZESTRIL) 10 MG tablet Take 10 mg by mouth daily.     No current facility-administered medications for this visit.      Physical Exam  Blood pressure 134/83, pulse 75, height 5\' 10"  (1.778 m), weight 154 lb (69.9 kg).  Constitutional: overall  normal hygiene, normal nutrition, well developed, normal grooming, normal body habitus. Assistive device:none  Musculoskeletal: gait and station Limp none, muscle tone and strength are normal, no tremors or atrophy is present.  .  Neurological: coordination overall normal.  Deep tendon reflex/nerve stretch intact.  Sensation normal.  Cranial nerves II-XII intact.   Skin:   Normal overall no scars, lesions, ulcers or rashes. No psoriasis.  Psychiatric: Alert and oriented x 3.  Recent memory intact, remote memory unclear.  Normal mood and affect. Well groomed.  Good eye contact.  Cardiovascular: overall no swelling, no varicosities, no edema bilaterally, normal temperatures of the legs and arms, no clubbing, cyanosis and good capillary refill.  Lymphatic: palpation is normal.  Little finger has full ROM and no pain.  All other systems reviewed and are negative   The patient has been educated about the nature of the problem(s) and counseled on treatment options.  The patient appeared to understand what I have discussed and is in agreement with it.  Encounter Diagnosis  Name Primary?  . Dislocation of proximal interphalangeal joint of left little finger, subsequent encounter Yes   X-rays of the little finger were done, reported separately.  PLAN Call if any problems.  Precautions discussed.  Continue current medications.   Return to clinic only as needed.  Discharge.   Electronically Signed Sanjuana Kava, MD 10/15/20208:26 AM

## 2019-10-12 ENCOUNTER — Other Ambulatory Visit: Payer: Self-pay

## 2019-10-12 DIAGNOSIS — Z20828 Contact with and (suspected) exposure to other viral communicable diseases: Secondary | ICD-10-CM | POA: Diagnosis not present

## 2019-10-12 DIAGNOSIS — Z20822 Contact with and (suspected) exposure to covid-19: Secondary | ICD-10-CM

## 2019-10-13 LAB — NOVEL CORONAVIRUS, NAA: SARS-CoV-2, NAA: NOT DETECTED

## 2019-10-15 ENCOUNTER — Telehealth: Payer: Self-pay | Admitting: General Practice

## 2019-10-15 NOTE — Telephone Encounter (Signed)
Gave patient covid test results °Patient understood  °

## 2019-11-07 ENCOUNTER — Other Ambulatory Visit: Payer: Self-pay

## 2019-11-07 DIAGNOSIS — Z20822 Contact with and (suspected) exposure to covid-19: Secondary | ICD-10-CM

## 2019-11-09 LAB — NOVEL CORONAVIRUS, NAA: SARS-CoV-2, NAA: NOT DETECTED

## 2019-12-06 DIAGNOSIS — R7301 Impaired fasting glucose: Secondary | ICD-10-CM | POA: Diagnosis not present

## 2019-12-06 DIAGNOSIS — Z125 Encounter for screening for malignant neoplasm of prostate: Secondary | ICD-10-CM | POA: Diagnosis not present

## 2019-12-06 DIAGNOSIS — I10 Essential (primary) hypertension: Secondary | ICD-10-CM | POA: Diagnosis not present

## 2019-12-06 DIAGNOSIS — R945 Abnormal results of liver function studies: Secondary | ICD-10-CM | POA: Diagnosis not present

## 2019-12-19 DIAGNOSIS — R7301 Impaired fasting glucose: Secondary | ICD-10-CM | POA: Diagnosis not present

## 2019-12-19 DIAGNOSIS — R945 Abnormal results of liver function studies: Secondary | ICD-10-CM | POA: Diagnosis not present

## 2019-12-19 DIAGNOSIS — I10 Essential (primary) hypertension: Secondary | ICD-10-CM | POA: Diagnosis not present

## 2019-12-19 DIAGNOSIS — Z0001 Encounter for general adult medical examination with abnormal findings: Secondary | ICD-10-CM | POA: Diagnosis not present

## 2020-01-08 ENCOUNTER — Other Ambulatory Visit: Payer: Self-pay

## 2020-01-08 ENCOUNTER — Ambulatory Visit: Payer: BC Managed Care – PPO | Attending: Internal Medicine

## 2020-01-08 DIAGNOSIS — Z20822 Contact with and (suspected) exposure to covid-19: Secondary | ICD-10-CM

## 2020-01-08 DIAGNOSIS — U071 COVID-19: Secondary | ICD-10-CM | POA: Insufficient documentation

## 2020-01-09 LAB — NOVEL CORONAVIRUS, NAA: SARS-CoV-2, NAA: DETECTED — AB

## 2020-04-29 ENCOUNTER — Other Ambulatory Visit: Payer: Self-pay

## 2020-04-29 ENCOUNTER — Emergency Department (HOSPITAL_COMMUNITY)
Admission: EM | Admit: 2020-04-29 | Discharge: 2020-04-29 | Disposition: A | Discharge status: Home / Self Care | Payer: BC Managed Care – PPO | Attending: Emergency Medicine | Admitting: Emergency Medicine

## 2020-04-29 ENCOUNTER — Encounter (HOSPITAL_COMMUNITY): Payer: Self-pay | Admitting: *Deleted

## 2020-04-29 DIAGNOSIS — T7840XA Allergy, unspecified, initial encounter: Secondary | ICD-10-CM | POA: Diagnosis not present

## 2020-04-29 DIAGNOSIS — L500 Allergic urticaria: Secondary | ICD-10-CM | POA: Diagnosis not present

## 2020-04-29 DIAGNOSIS — Z79899 Other long term (current) drug therapy: Secondary | ICD-10-CM | POA: Insufficient documentation

## 2020-04-29 DIAGNOSIS — I10 Essential (primary) hypertension: Secondary | ICD-10-CM | POA: Insufficient documentation

## 2020-04-29 DIAGNOSIS — F17210 Nicotine dependence, cigarettes, uncomplicated: Secondary | ICD-10-CM | POA: Diagnosis not present

## 2020-04-29 DIAGNOSIS — L509 Urticaria, unspecified: Secondary | ICD-10-CM | POA: Diagnosis not present

## 2020-04-29 MED ORDER — FAMOTIDINE IN NACL 20-0.9 MG/50ML-% IV SOLN
20.0000 mg | Freq: Once | INTRAVENOUS | Status: AC
  Administered 2020-04-29: 20 mg via INTRAVENOUS
  Filled 2020-04-29: qty 50

## 2020-04-29 MED ORDER — METHYLPREDNISOLONE SODIUM SUCC 125 MG IJ SOLR
125.0000 mg | Freq: Once | INTRAMUSCULAR | Status: AC
  Administered 2020-04-29: 125 mg via INTRAVENOUS
  Filled 2020-04-29: qty 2

## 2020-04-29 NOTE — Discharge Instructions (Signed)
Please follow up with your PCP regarding your ED visit today Continue taking Benadryl for the next few days  Return to the ED IMMEDIATELY for any worsening symptoms including worsening rash, facial swelling including lips, tongue, throat, shortness of breath, nausea, vomiting, abdominal pain

## 2020-04-29 NOTE — ED Provider Notes (Signed)
[facility] EMERGENCY DEPARTMENT Provider Note   CSN: [id number] Arrival date & time: 04/29/20  1842     History Chief Complaint  Patient presents with  . Allergic Reaction    Andrew Ferguson is a 49 y.o. male with PMHx HTN who presents to the ED for possible allergic reaction. Reports that about 4:45 PM this afternoon he began feeling itchy all over and noticed hives. He took a Benadryl without relief prompting him to come to the ED for further evaluation. Pt denies shortness of breath, tongue/lip/throat swelling, nausea, vomiting, abdominal pain, or any other associated symptoms. Pt denies new medicines, hygiene products, foods. 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   The history is provided by the patient and medical records.       Past Medical History:  Diagnosis Date  . Hypertension     There are no problems to display for this patient.   Past Surgical History:  Procedure Laterality Date  . TYMPANOSTOMY TUBE PLACEMENT Bilateral        Family History  Problem Relation Age of Onset  . Healthy Mother   . Healthy Father     Social History   Tobacco Use  . Smoking status: Current Every Day Smoker    Packs/day: 0.25    Types: Cigarettes  . Smokeless tobacco: Current User    Types: Chew  . Tobacco comment: 3 cigarettes per day  Substance Use Topics  . Alcohol use: Yes    Comment: 6-7 beers every other day   . Drug use: No    Home Medications Prior to Admission medications   Medication Sig Start Date End Date Taking? Authorizing Provider  lisinopril (PRINIVIL,ZESTRIL) 10 MG tablet Take 10 mg by mouth at bedtime.    Yes [provider]    Allergies    Carrot oil, Codeine, and Penicillins  Review of Systems   Review of Systems  Constitutional: Negative for chills and fever.  Respiratory: Negative for shortness of breath.   Skin: Positive for rash.  All other systems reviewed and are  negative.   Physical Exam Updated Vital Signs BP (!) 136/96 (BP Location: Left Arm)   Pulse 63   Temp 98 F (36.7 C) (Oral)   Resp 17   Ht 5\' 10"  (1.778 m)   Wt 72.6 kg   SpO2 99%   BMI 22.96 kg/m   Physical Exam Vitals and nursing note reviewed.  Constitutional:      Appearance: He is not ill-appearing or diaphoretic.  HENT:     Head: Normocephalic and atraumatic.     Mouth/Throat:     Mouth: Mucous membranes are moist.     Pharynx: No oropharyngeal exudate or posterior oropharyngeal erythema.     Comments: No posterior oropharyngeal edema noted. Uvula midline. Airway patent.  Eyes:     Conjunctiva/sclera: Conjunctivae normal.  Cardiovascular:     Rate and Rhythm: Normal rate and regular rhythm.  Pulmonary:     Effort: Pulmonary effort is normal.     Breath sounds: Normal breath sounds. No wheezing, rhonchi or rales.  Abdominal:     Palpations: Abdomen is soft.     Tenderness: There is no abdominal tenderness. There is no guarding or rebound.  Musculoskeletal:     Cervical back: Neck supple.  Skin:    General: Skin is warm and dry.     Findings: Rash present.     Comments:  Diffuse urticarial rash noted to trunk and extremities  Neurological:     Mental Status: He is alert.     ED Results / Procedures / Treatments   Labs (all labs ordered are listed, but only abnormal results are displayed) Labs Reviewed - No data to display  EKG None  Radiology No results found.  Procedures Procedures (including critical care time)  Medications Ordered in ED Medications  methylPREDNISolone sodium succinate (SOLU-MEDROL) 125 mg/2 mL injection 125 mg (125 mg Intravenous Given 04/29/20 1938)  famotidine (PEPCID) IVPB 20 mg premix ( Intravenous Restarted 04/29/20 2014)    ED Course  I have reviewed the triage vital signs and the nursing notes.  Pertinent labs & imaging results that were available during my care of the patient were reviewed by me and considered in my  medical decision making (see chart for details).    MDM Rules/Calculators/A&P                      49 year old male presenting to the ED with rash/urticaria diffusely that occurred around 4:45 PM today. Unsure of cause. No new foods, products, medications. On arrival to the ED pt is afebrile, nontachycardic, and nontachypneic. Appears to be in NAD. Resting comfortably. No oral involvement. No wheezing. Has already taken Benadryl at home. Will treat with solumedrol and pepcid and reevaluate.   After medications pt reports improvement in symptoms. No longer feeling itchy. On reeval his rash has subsided significantly. Still mildly present on torso however feel he is stable for discharge home. Have advised he follow up with his PCP regarding ED visit. Strict return precautions discussed. Pt is in agreement with plan and stable for discharge home.   This note was prepared using Dragon voice recognition software and may include unintentional dictation errors due to the inherent limitations of voice recognition software. Final Clinical Impression(s) / ED Diagnoses Final diagnoses:  Allergic reaction, initial encounter    Rx / DC Orders ED Discharge Orders    None       Discharge Instructions     Please follow up with your PCP regarding your ED visit today Continue taking Benadryl for the next few days  Return to the ED IMMEDIATELY for any worsening symptoms including worsening rash, facial swelling including lips, tongue, throat, shortness of breath, nausea, vomiting, abdominal pain       Tanda Rockers, PA-C 04/29/20 2053    Pollyann Savoy, MD 04/29/20 2256

## 2020-04-29 NOTE — ED Triage Notes (Addendum)
Pt c/o red, itchy rash that started breaking out all over him about 1 hour ago. Pt reports it only keeps getting worse. Pt reports on his drive over to the ED he started noticing difficulty breathing. Pt able to speak in complete sentences at time of triage. Pt has no idea what made the reaction happen. Pt is red all over.

## 2020-06-19 DIAGNOSIS — R7303 Prediabetes: Secondary | ICD-10-CM | POA: Diagnosis not present

## 2020-06-19 DIAGNOSIS — Z0001 Encounter for general adult medical examination with abnormal findings: Secondary | ICD-10-CM | POA: Diagnosis not present

## 2020-06-19 DIAGNOSIS — I10 Essential (primary) hypertension: Secondary | ICD-10-CM | POA: Diagnosis not present

## 2020-06-19 DIAGNOSIS — F17210 Nicotine dependence, cigarettes, uncomplicated: Secondary | ICD-10-CM | POA: Diagnosis not present

## 2020-06-19 DIAGNOSIS — R7301 Impaired fasting glucose: Secondary | ICD-10-CM | POA: Diagnosis not present

## 2020-06-19 DIAGNOSIS — J301 Allergic rhinitis due to pollen: Secondary | ICD-10-CM | POA: Diagnosis not present

## 2020-06-19 DIAGNOSIS — Z0000 Encounter for general adult medical examination without abnormal findings: Secondary | ICD-10-CM | POA: Diagnosis not present

## 2020-06-23 DIAGNOSIS — F17210 Nicotine dependence, cigarettes, uncomplicated: Secondary | ICD-10-CM | POA: Diagnosis not present

## 2020-06-23 DIAGNOSIS — Z0001 Encounter for general adult medical examination with abnormal findings: Secondary | ICD-10-CM | POA: Diagnosis not present

## 2020-06-23 DIAGNOSIS — Z720 Tobacco use: Secondary | ICD-10-CM | POA: Diagnosis not present

## 2020-06-25 ENCOUNTER — Other Ambulatory Visit (HOSPITAL_COMMUNITY): Payer: Self-pay | Admitting: Radiology

## 2020-06-25 DIAGNOSIS — R0602 Shortness of breath: Secondary | ICD-10-CM

## 2020-08-19 ENCOUNTER — Encounter (HOSPITAL_COMMUNITY): Payer: BC Managed Care – PPO

## 2020-10-03 ENCOUNTER — Other Ambulatory Visit (HOSPITAL_COMMUNITY)
Admission: RE | Admit: 2020-10-03 | Discharge: 2020-10-03 | Disposition: A | Discharge status: Home / Self Care | Payer: BC Managed Care – PPO | Source: Ambulatory Visit | Attending: Internal Medicine | Admitting: Internal Medicine

## 2020-10-03 ENCOUNTER — Other Ambulatory Visit: Payer: Self-pay

## 2020-10-03 DIAGNOSIS — Z01812 Encounter for preprocedural laboratory examination: Secondary | ICD-10-CM | POA: Diagnosis not present

## 2020-10-03 DIAGNOSIS — Z20822 Contact with and (suspected) exposure to covid-19: Secondary | ICD-10-CM | POA: Diagnosis not present

## 2020-10-03 LAB — SARS CORONAVIRUS 2 (TAT 6-24 HRS): SARS Coronavirus 2: NEGATIVE

## 2020-10-07 ENCOUNTER — Ambulatory Visit (HOSPITAL_COMMUNITY)
Admission: RE | Admit: 2020-10-07 | Discharge: 2020-10-07 | Disposition: A | Discharge status: Home / Self Care | Payer: BC Managed Care – PPO | Source: Ambulatory Visit | Attending: Internal Medicine | Admitting: Internal Medicine

## 2020-10-07 ENCOUNTER — Other Ambulatory Visit: Payer: Self-pay

## 2020-10-07 DIAGNOSIS — R0602 Shortness of breath: Secondary | ICD-10-CM | POA: Diagnosis not present

## 2020-10-07 LAB — PULMONARY FUNCTION TEST
DL/VA % pred: 96 %
DL/VA: 4.27 ml/min/mmHg/L
DLCO unc % pred: 101 %
DLCO unc: 29.65 ml/min/mmHg
FEF 25-75 Post: 2.4 L/sec
FEF 25-75 Pre: 1.56 L/sec
FEF2575-%Change-Post: 53 %
FEF2575-%Pred-Post: 70 %
FEF2575-%Pred-Pre: 45 %
FEV1-%Change-Post: 17 %
FEV1-%Pred-Post: 87 %
FEV1-%Pred-Pre: 74 %
FEV1-Post: 3.43 L
FEV1-Pre: 2.91 L
FEV1FVC-%Change-Post: 10 %
FEV1FVC-%Pred-Pre: 72 %
FEV6-%Change-Post: 8 %
FEV6-%Pred-Post: 110 %
FEV6-%Pred-Pre: 101 %
FEV6-Post: 5.34 L
FEV6-Pre: 4.94 L
FEV6FVC-%Change-Post: 1 %
FEV6FVC-%Pred-Post: 100 %
FEV6FVC-%Pred-Pre: 99 %
FVC-%Change-Post: 6 %
FVC-%Pred-Post: 109 %
FVC-%Pred-Pre: 102 %
FVC-Post: 5.51 L
FVC-Pre: 5.17 L
Post FEV1/FVC ratio: 62 %
Post FEV6/FVC ratio: 97 %
Pre FEV1/FVC ratio: 56 %
Pre FEV6/FVC Ratio: 96 %
RV % pred: 162 %
RV: 3.35 L
TLC % pred: 121 %
TLC: 8.48 L

## 2020-10-07 MED ORDER — ALBUTEROL SULFATE (2.5 MG/3ML) 0.083% IN NEBU
2.5000 mg | INHALATION_SOLUTION | Freq: Once | RESPIRATORY_TRACT | Status: AC
Start: 2020-10-07 — End: 2020-10-07
  Administered 2020-10-07: 2.5 mg via RESPIRATORY_TRACT

## 2020-12-12 DIAGNOSIS — Z720 Tobacco use: Secondary | ICD-10-CM | POA: Diagnosis not present

## 2020-12-12 DIAGNOSIS — J301 Allergic rhinitis due to pollen: Secondary | ICD-10-CM | POA: Diagnosis not present

## 2020-12-12 DIAGNOSIS — R7301 Impaired fasting glucose: Secondary | ICD-10-CM | POA: Diagnosis not present

## 2020-12-12 DIAGNOSIS — I10 Essential (primary) hypertension: Secondary | ICD-10-CM | POA: Diagnosis not present

## 2020-12-18 DIAGNOSIS — F17210 Nicotine dependence, cigarettes, uncomplicated: Secondary | ICD-10-CM | POA: Diagnosis not present

## 2020-12-18 DIAGNOSIS — Z0001 Encounter for general adult medical examination with abnormal findings: Secondary | ICD-10-CM | POA: Diagnosis not present

## 2020-12-18 DIAGNOSIS — Z716 Tobacco abuse counseling: Secondary | ICD-10-CM | POA: Diagnosis not present

## 2021-05-28 DIAGNOSIS — U071 COVID-19: Secondary | ICD-10-CM | POA: Diagnosis not present

## 2021-06-15 DIAGNOSIS — Z720 Tobacco use: Secondary | ICD-10-CM | POA: Diagnosis not present

## 2021-06-15 DIAGNOSIS — J301 Allergic rhinitis due to pollen: Secondary | ICD-10-CM | POA: Diagnosis not present

## 2021-06-15 DIAGNOSIS — R7301 Impaired fasting glucose: Secondary | ICD-10-CM | POA: Diagnosis not present

## 2021-06-15 DIAGNOSIS — I10 Essential (primary) hypertension: Secondary | ICD-10-CM | POA: Diagnosis not present

## 2021-06-18 DIAGNOSIS — Z8616 Personal history of COVID-19: Secondary | ICD-10-CM | POA: Diagnosis not present

## 2021-06-18 DIAGNOSIS — I10 Essential (primary) hypertension: Secondary | ICD-10-CM | POA: Diagnosis not present

## 2021-06-18 DIAGNOSIS — R7303 Prediabetes: Secondary | ICD-10-CM | POA: Diagnosis not present

## 2021-07-01 ENCOUNTER — Ambulatory Visit
Admission: EM | Admit: 2021-07-01 | Discharge: 2021-07-01 | Disposition: A | Discharge status: Home / Self Care | Payer: BC Managed Care – PPO | Attending: Family Medicine | Admitting: Family Medicine

## 2021-07-01 ENCOUNTER — Ambulatory Visit (INDEPENDENT_AMBULATORY_CARE_PROVIDER_SITE_OTHER): Payer: BC Managed Care – PPO

## 2021-07-01 ENCOUNTER — Other Ambulatory Visit: Payer: Self-pay

## 2021-07-01 DIAGNOSIS — R109 Unspecified abdominal pain: Secondary | ICD-10-CM | POA: Diagnosis not present

## 2021-07-01 DIAGNOSIS — S20212A Contusion of left front wall of thorax, initial encounter: Secondary | ICD-10-CM

## 2021-07-01 DIAGNOSIS — R0781 Pleurodynia: Secondary | ICD-10-CM | POA: Diagnosis not present

## 2021-07-01 NOTE — ED Provider Notes (Signed)
[facility] CARE CENTER   [id number] 07/01/21 Arrival Time: 1243  ASSESSMENT & PLAN:  1. Rib contusion, left, initial encounter     I have personally viewed the imaging studies ordered this visit. No rib fractures or pneumothorax appreciated.  Reviewed expectations re: course of current medical issues. Questions answered. Outlined signs and symptoms indicating need for more acute intervention. Patient verbalized understanding. After Visit Summary given.   SUBJECTIVE:  History from: patient. Andrew Ferguson is a 50 y.o. male who presents with complaint of L posterior rib pain s/p fall yest evening. Worse with movements and deep breaths. No SOB reported. Ambulatory without difficulty. Ibuprofen helps.  Social History   Tobacco Use  Smoking Status Every Day   Packs/day: 0.25   Pack years: 0.00   Types: Cigarettes  Smokeless Tobacco Current   Types: Chew  Tobacco Comments   3 cigarettes per day    OBJECTIVE:  Vitals:   07/01/21 1337  BP: (!) 149/84  Pulse: 84  Resp: 20  Temp: 98.7 F (37.1 C)  SpO2: 94%    General appearance: alert, oriented, no acute distress Eyes: PERRLA; EOMI; conjunctivae normal HENT: normocephalic; atraumatic Neck: supple with FROM Lungs: without labored respirations; speaks full sentences without difficulty; CTAB Very TTP over L posterior lateral ribs without bruising or gross abnormalities Skin: warm and dry; without rash or lesions Neuro: normal gait Psychological: alert and cooperative; normal mood and affect  Labs: Results for orders placed or performed during the hospital encounter of 10/03/20  SARS CORONAVIRUS 2 (TAT 6-24 HRS) Nasopharyngeal Nasopharyngeal Swab   Specimen: Nasopharyngeal Swab  Result Value Ref Range   SARS Coronavirus 2 NEGATIVE NEGATIVE   Labs Reviewed - No data to display  Imaging: DG Ribs Unilateral W/Chest Left  Result Date: 07/01/2021 CLINICAL DATA:  Fall last night with LEFT-sided rib and flank pain.  EXAM: LEFT RIBS AND CHEST - 3+ VIEW COMPARISON:  January 2009. FINDINGS: Trachea is midline. Cardiomediastinal contours and hilar structures are normal. Biapical pleural scarring similar to the more remote study. No lobar consolidative changes.  No sign of pleural effusion. No pneumothorax. No displaced rib fracture. IMPRESSION: No acute cardiopulmonary disease. No displaced rib fracture. Electronically Signed   By: Donzetta Kohut M.D.   On: 07/01/2021 14:18     Allergies  Allergen Reactions   Carrot Oil Hives   Codeine Swelling   Penicillins Swelling    Past Medical History:  Diagnosis Date   Hypertension    Social History   Socioeconomic History   Marital status: Married    Spouse name: Not on file   Number of children: Not on file   Years of education: Not on file   Highest education level: Not on file  Occupational History   Not on file  Tobacco Use   Smoking status: Every Day    Packs/day: 0.25    Pack years: 0.00    Types: Cigarettes   Smokeless tobacco: Current    Types: Chew   Tobacco comments:    3 cigarettes per day  Vaping Use   Vaping Use: Never used  Substance and Sexual Activity   Alcohol use: Yes    Comment: 6-7 beers every other day    Drug use: No   Sexual activity: Not on file  Other Topics Concern   Not on file  Social History Narrative   Not on file   Social Determinants of Health   Financial Resource Strain: Not on file  Food Insecurity: Not  on file  Transportation Needs: Not on file  Physical Activity: Not on file  Stress: Not on file  Social Connections: Not on file  Intimate Partner Violence: Not on file   Family History  Problem Relation Age of Onset   Healthy Mother    Healthy Father    Past Surgical History:  Procedure Laterality Date   TYMPANOSTOMY TUBE PLACEMENT Bilateral       Mardella Layman, MD 07/01/21 1452

## 2021-07-01 NOTE — Discharge Instructions (Addendum)
Continue taking ibuprofen 800mg  every 8 hours with food.

## 2021-07-01 NOTE — ED Triage Notes (Signed)
Pt presents with left flank pain from fall on pavers last night, pt also hit right temple when a birdbath fell on him

## 2021-09-04 IMAGING — DX DG RIBS W/ CHEST 3+V*L*
3 series · 3 of 3 positions shown · non-contrast
Comparison: December 2007.

CLINICAL DATA: Fall last night with LEFT-sided rib and flank pain.

EXAM:
LEFT RIBS AND CHEST - 3+ VIEW

[chest pa]
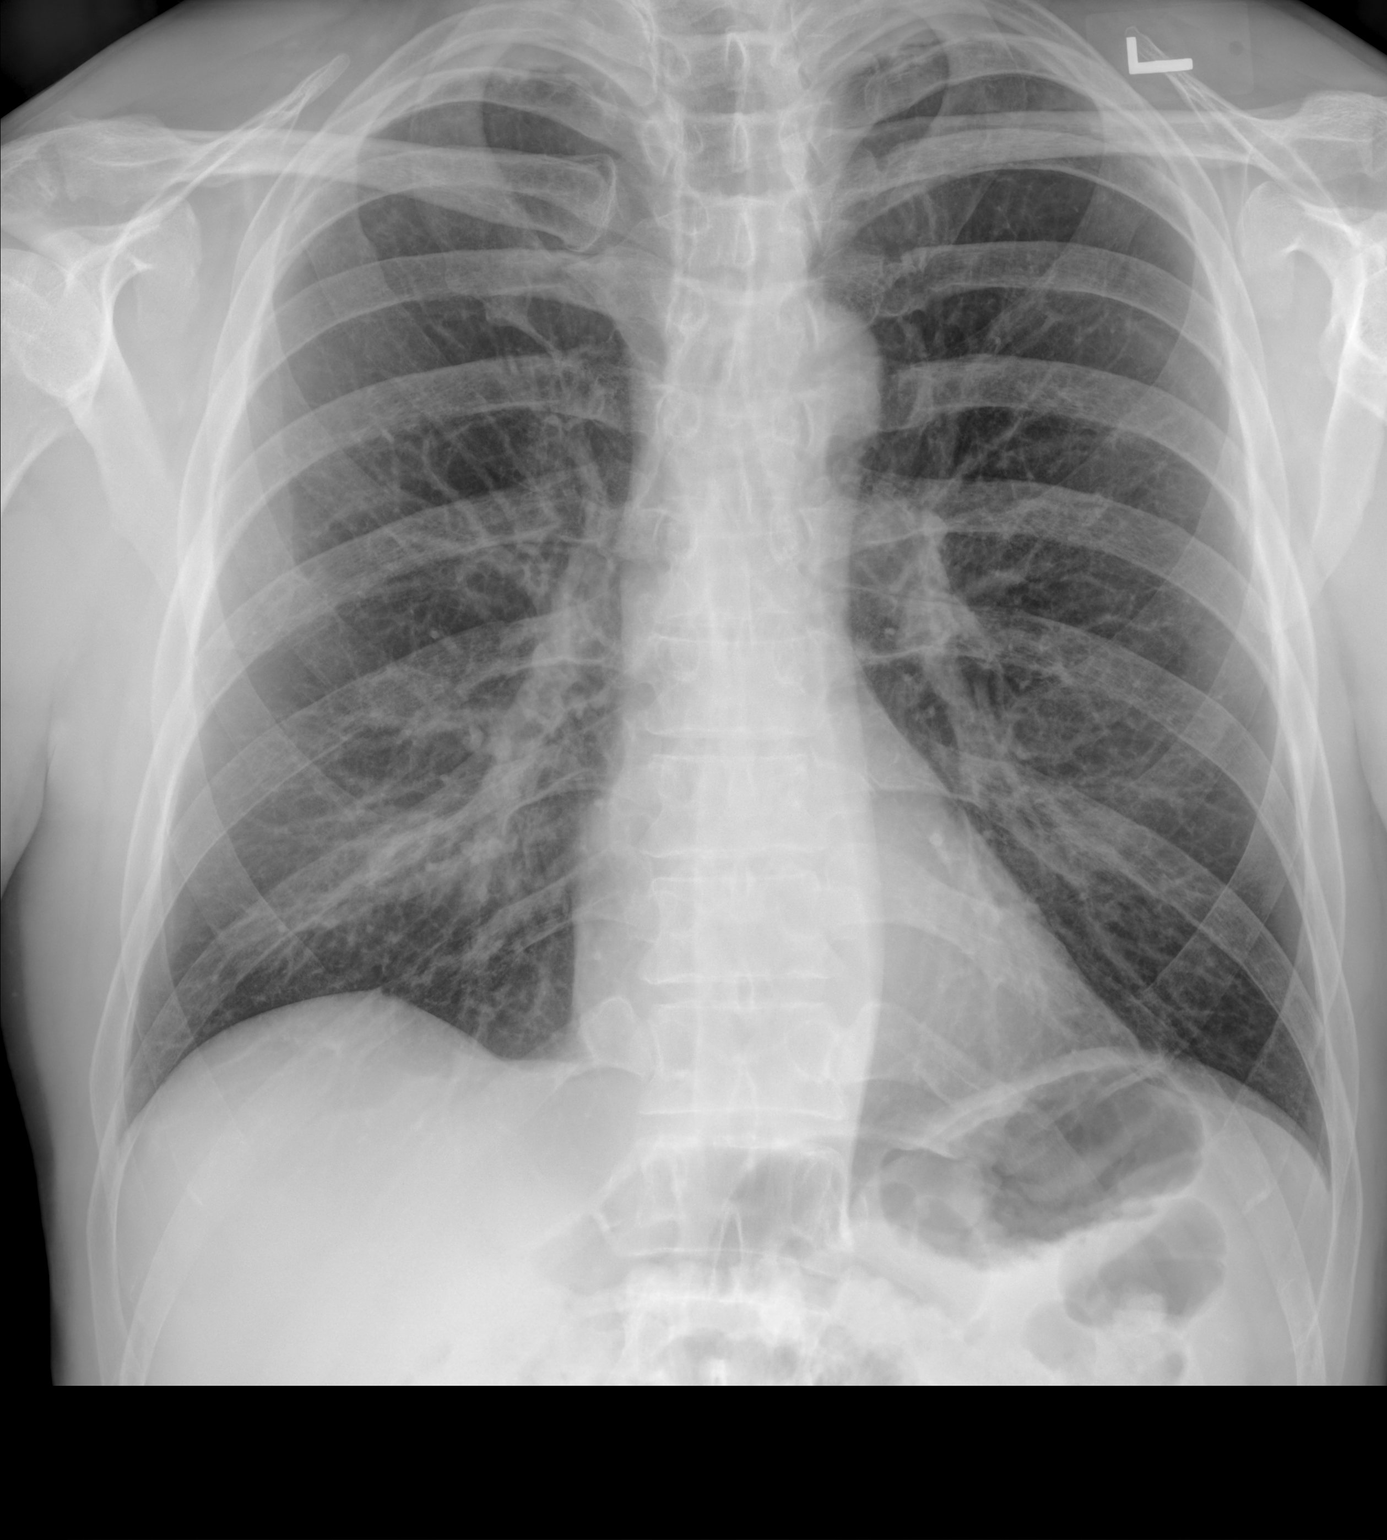

[hemithorax (ribs) ap]
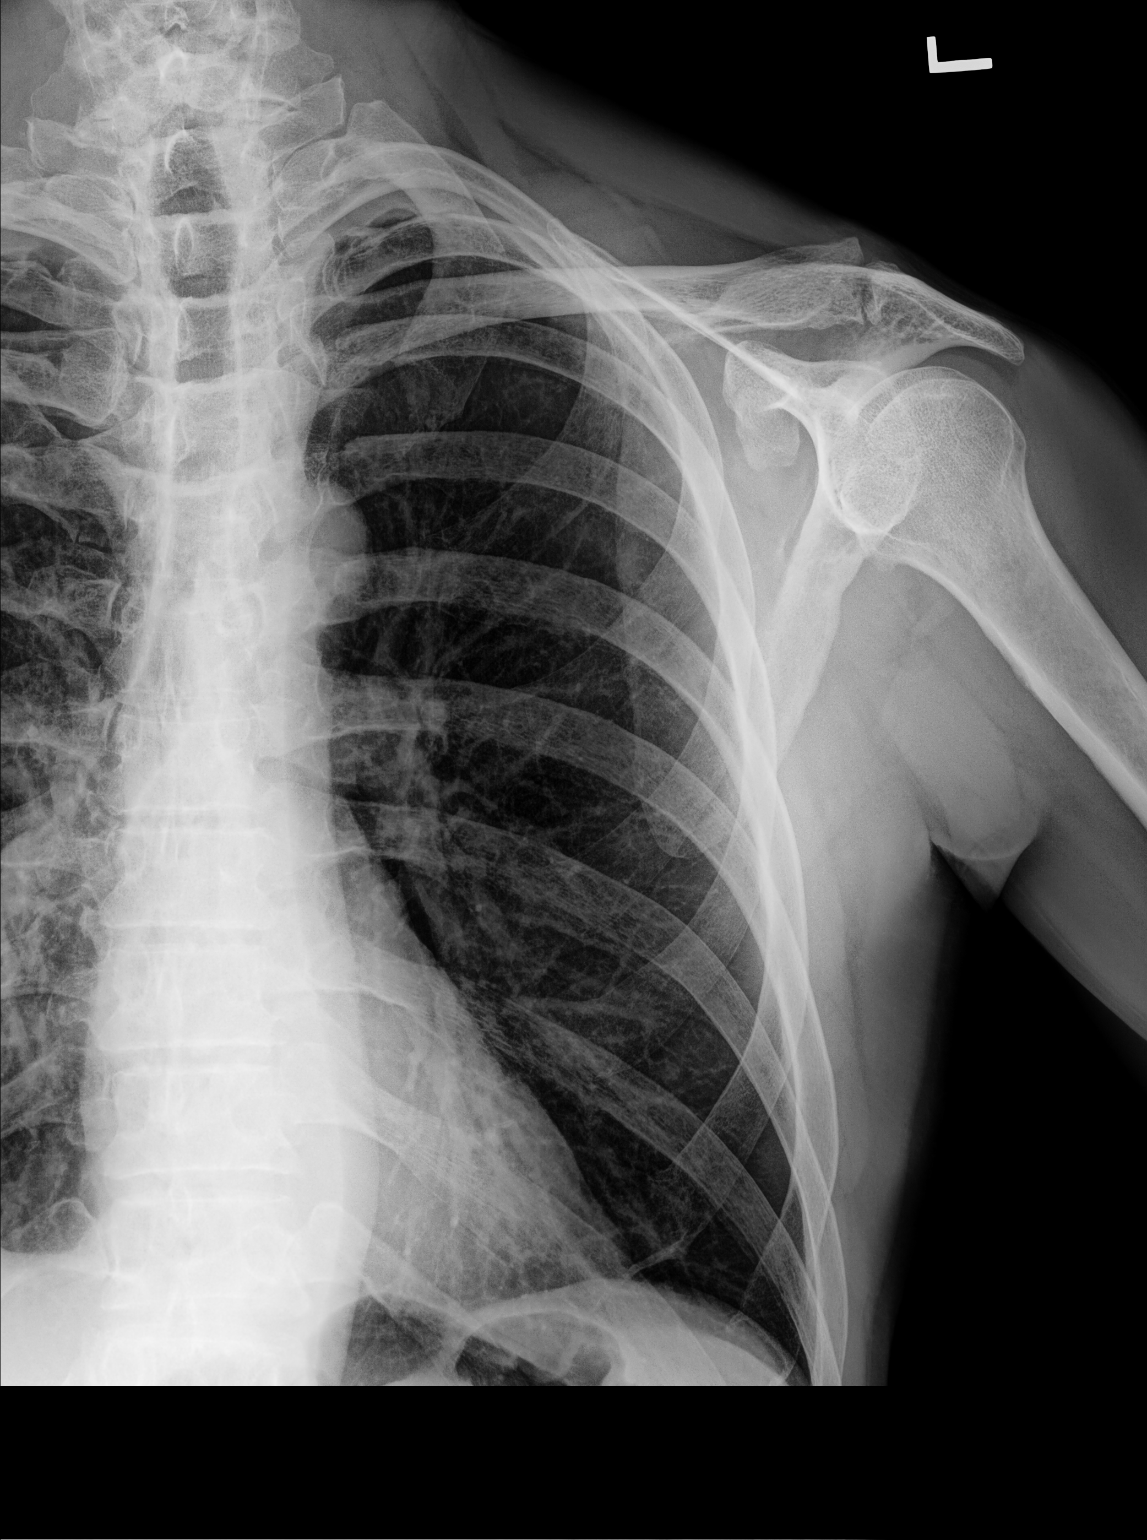

[hemithorax (ribs) mlo]
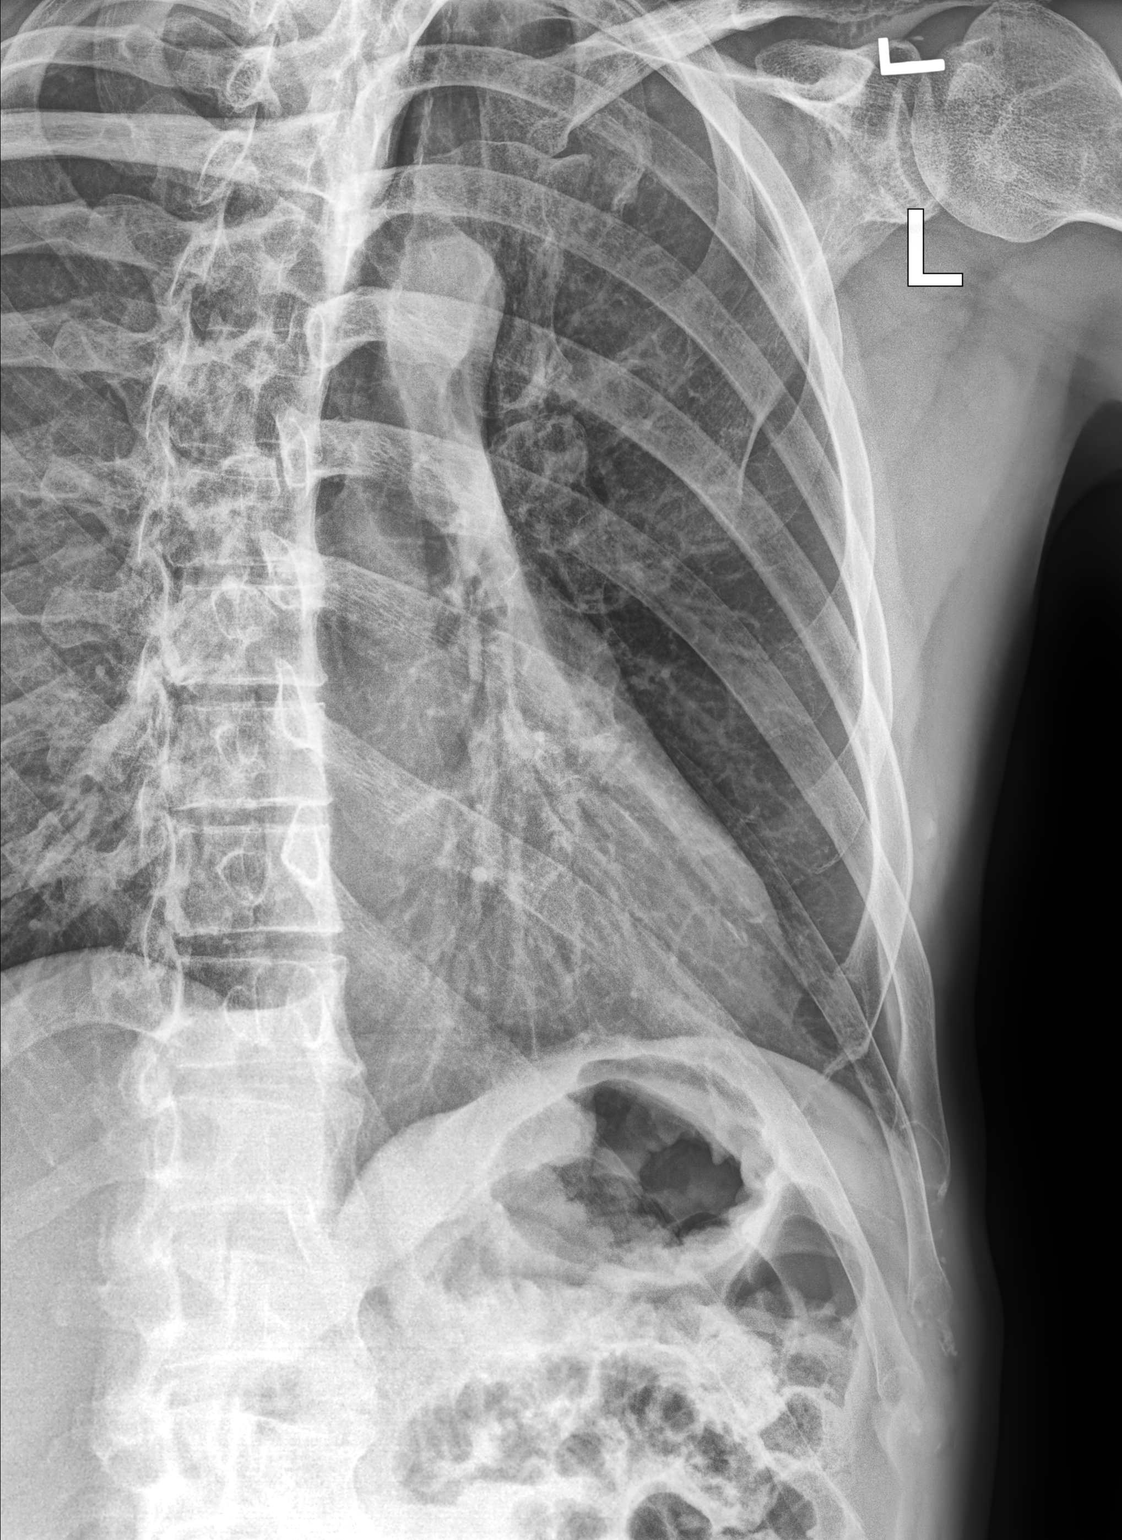

[3 of 3 positions shown; findings below may reference images not displayed]

FINDINGS: Trachea is midline. Cardiomediastinal contours and hilar structures
are normal.

Biapical pleural scarring similar to the more remote study.

No lobar consolidative changes.  No sign of pleural effusion.

No pneumothorax.

No displaced rib fracture.
IMPRESSION: No acute cardiopulmonary disease. No displaced rib fracture.

## 2021-12-17 DIAGNOSIS — Z125 Encounter for screening for malignant neoplasm of prostate: Secondary | ICD-10-CM | POA: Diagnosis not present

## 2021-12-17 DIAGNOSIS — R7303 Prediabetes: Secondary | ICD-10-CM | POA: Diagnosis not present

## 2021-12-22 DIAGNOSIS — Z0001 Encounter for general adult medical examination with abnormal findings: Secondary | ICD-10-CM | POA: Diagnosis not present

## 2022-01-29 DIAGNOSIS — Z1211 Encounter for screening for malignant neoplasm of colon: Secondary | ICD-10-CM | POA: Diagnosis not present

## 2022-01-29 LAB — COLOGUARD: Cologuard: NEGATIVE

## 2022-02-07 LAB — COLOGUARD: COLOGUARD: NEGATIVE

## 2022-02-07 LAB — EXTERNAL GENERIC LAB PROCEDURE: COLOGUARD: NEGATIVE

## 2022-02-08 DIAGNOSIS — R000 Tachycardia, unspecified: Secondary | ICD-10-CM | POA: Diagnosis not present

## 2022-02-08 DIAGNOSIS — I10 Essential (primary) hypertension: Secondary | ICD-10-CM | POA: Diagnosis not present

## 2022-02-08 DIAGNOSIS — L821 Other seborrheic keratosis: Secondary | ICD-10-CM | POA: Diagnosis not present

## 2022-02-08 DIAGNOSIS — L820 Inflamed seborrheic keratosis: Secondary | ICD-10-CM | POA: Diagnosis not present

## 2022-02-22 DIAGNOSIS — I10 Essential (primary) hypertension: Secondary | ICD-10-CM | POA: Diagnosis not present

## 2022-02-22 DIAGNOSIS — R000 Tachycardia, unspecified: Secondary | ICD-10-CM | POA: Diagnosis not present

## 2022-07-08 ENCOUNTER — Emergency Department (HOSPITAL_COMMUNITY): Payer: BLUE CROSS/BLUE SHIELD

## 2022-07-08 ENCOUNTER — Other Ambulatory Visit: Payer: Self-pay

## 2022-07-08 ENCOUNTER — Emergency Department (HOSPITAL_COMMUNITY)
Admission: EM | Admit: 2022-07-08 | Discharge: 2022-07-08 | Disposition: A | Discharge status: Home / Self Care | Payer: BLUE CROSS/BLUE SHIELD | Attending: Emergency Medicine | Admitting: Emergency Medicine

## 2022-07-08 ENCOUNTER — Encounter (HOSPITAL_COMMUNITY): Payer: Self-pay

## 2022-07-08 DIAGNOSIS — Z23 Encounter for immunization: Secondary | ICD-10-CM | POA: Diagnosis not present

## 2022-07-08 DIAGNOSIS — S81811A Laceration without foreign body, right lower leg, initial encounter: Secondary | ICD-10-CM | POA: Diagnosis not present

## 2022-07-08 DIAGNOSIS — W109XXA Fall (on) (from) unspecified stairs and steps, initial encounter: Secondary | ICD-10-CM | POA: Diagnosis not present

## 2022-07-08 DIAGNOSIS — S8991XA Unspecified injury of right lower leg, initial encounter: Secondary | ICD-10-CM | POA: Diagnosis present

## 2022-07-08 MED ORDER — LIDOCAINE-EPINEPHRINE (PF) 2 %-1:200000 IJ SOLN
20.0000 mL | Freq: Once | INTRAMUSCULAR | Status: AC
  Administered 2022-07-08: 20 mL via INTRADERMAL
  Filled 2022-07-08: qty 20

## 2022-07-08 MED ORDER — TETANUS-DIPHTH-ACELL PERTUSSIS 5-2.5-18.5 LF-MCG/0.5 IM SUSY
0.5000 mL | PREFILLED_SYRINGE | Freq: Once | INTRAMUSCULAR | Status: AC
  Administered 2022-07-08: 0.5 mL via INTRAMUSCULAR
  Filled 2022-07-08: qty 0.5

## 2022-07-08 MED ORDER — POVIDONE-IODINE 10 % EX SOLN
CUTANEOUS | Status: AC
  Filled 2022-07-08: qty 14.8

## 2022-07-08 NOTE — ED Provider Notes (Signed)
[facility] EMERGENCY DEPARTMENT Provider Note   CSN: [id number] Arrival date & time: 07/08/22  1813     History  Chief Complaint  Patient presents with   Laceration    Andrew Ferguson is a 51 y.o. male who presents to the emergency department for evaluation of a large laceration to his anterior right shin that occurred after missing a step of a stepdown tractor.  He is not on blood thinners.  He did not attempt to clean the wound prior to arrival.  They placed a towel on it and came straight to the emergency department.  He denies numbness and tingling.  He is ambulatory.  He has no other complaints.   Laceration      Home Medications Prior to Admission medications   Medication Sig Start Date End Date Taking? Authorizing Provider  lisinopril (PRINIVIL,ZESTRIL) 10 MG tablet Take 10 mg by mouth at bedtime.     [provider]      Allergies    Carrot oil, Codeine, and Penicillins    Review of Systems   Review of Systems  Musculoskeletal:  Negative for gait problem.  Skin:  Positive for wound.    Physical Exam Updated Vital Signs BP (!) 149/86 (BP Location: Left Arm)   Pulse (!) 113   Temp 97.7 F (36.5 C) (Oral)   Resp 19   Ht 5\' 10"  (1.778 m)   Wt 74.8 kg   SpO2 97%   BMI 23.68 kg/m  Physical Exam Vitals and nursing note reviewed.  Constitutional:      General: He is not in acute distress.    Appearance: He is not ill-appearing.  HENT:     Head: Atraumatic.  Eyes:     Conjunctiva/sclera: Conjunctivae normal.  Cardiovascular:     Rate and Rhythm: Normal rate and regular rhythm.     Pulses: Normal pulses.          Radial pulses are 2+ on the right side and 2+ on the left side.       Dorsalis pedis pulses are 2+ on the right side and 2+ on the left side.     Heart sounds: No murmur heard. Pulmonary:     Effort: Pulmonary effort is normal. No respiratory distress.     Breath sounds: Normal breath sounds.  Abdominal:     General: Abdomen is  flat. There is no distension.     Palpations: Abdomen is soft.     Tenderness: There is no abdominal tenderness.  Musculoskeletal:        General: Normal range of motion.     Cervical back: Normal range of motion.  Skin:    General: Skin is warm and dry.     Capillary Refill: Capillary refill takes less than 2 seconds.     Comments: Large triangle laceration to the right anterior shin.  Pictures provided below.  No visible bone.  Musculature visible.  Neurological:     General: No focal deficit present.     Mental Status: He is alert.  Psychiatric:        Mood and Affect: Mood normal.      ED Results / Procedures / Treatments   Labs (all labs ordered are listed, but only abnormal results are displayed) Labs Reviewed - No data to display  EKG None  Radiology DG Tibia/Fibula Right  Result Date: 07/08/2022 CLINICAL DATA:  Laceration to right leg EXAM: RIGHT TIBIA AND FIBULA - 2 VIEW COMPARISON:  None Available.  FINDINGS: Gas within the medial soft tissues adjacent to the proximal tibia. No acute bony abnormality. Specifically, no fracture, subluxation, or dislocation. No visible joint effusion in the right knee. No radiopaque foreign body. IMPRESSION: No acute bony abnormality. Electronically Signed   By: Charlett Nose M.D.   On: 07/08/2022 18:57    Procedures .Marland KitchenLaceration Repair  Date/Time: 07/08/2022 10:26 PM  Performed by: Janell Quiet, PA-C Authorized by: Janell Quiet, PA-C   Consent:    Consent obtained:  Verbal   Consent given by:  Patient   Risks discussed:  Infection, need for additional repair, pain, poor cosmetic result and poor wound healing   Alternatives discussed:  No treatment and delayed treatment Universal protocol:    Procedure explained and questions answered to patient or proxy's satisfaction: yes     Relevant documents present and verified: yes     Test results available: yes     Imaging studies available: yes     Required blood products,  implants, devices, and special equipment available: yes     Site/side marked: yes     Immediately prior to procedure, a time out was called: yes     Patient identity confirmed:  Verbally with patient Anesthesia:    Anesthesia method:  Local infiltration   Local anesthetic:  Lidocaine 2% WITH epi Laceration details:    Location:  Leg   Leg location:  R lower leg   Length (cm):  20   Depth (mm):  5 Pre-procedure details:    Preparation:  Imaging obtained to evaluate for foreign bodies Exploration:    Limited defect created (wound extended): yes     Hemostasis achieved with:  Epinephrine and direct pressure   Imaging obtained: x-ray     Imaging outcome: foreign body not noted     Wound exploration: wound explored through full range of motion and entire depth of wound visualized     Wound extent: muscle damage     Wound extent: no foreign bodies/material noted   Treatment:    Area cleansed with:  Povidone-iodine   Amount of cleaning:  Extensive   Irrigation solution:  Sterile water   Irrigation volume:  1L   Irrigation method:  Syringe   Visualized foreign bodies/material removed: no     Debridement:  None   Undermining:  None   Scar revision: no   Skin repair:    Repair method:  Staples and sutures   Suture size:  4-0   Suture material:  Prolene   Suture technique:  Horizontal mattress   Number of sutures:  3 (1 horizontal suture placed on each side of wound and at tip of triangle)   Number of staples:  16 Approximation:    Approximation:  Close Repair type:    Repair type:  Complex Post-procedure details:    Dressing:  Non-adherent dressing   Procedure completion:  Tolerated     Medications Ordered in ED Medications  Tdap (BOOSTRIX) injection 0.5 mL (0.5 mLs Intramuscular Given 07/08/22 1833)  lidocaine-EPINEPHrine (XYLOCAINE W/EPI) 2 %-1:200000 (PF) injection 20 mL (20 mLs Intradermal Given by Other 07/08/22 2039)  povidone-iodine (BETADINE) 10 % external solution (   Given by Other 07/08/22 2039)    ED Course/ Medical Decision Making/ A&P                           Medical Decision Making Amount and/or Complexity of Data Reviewed Radiology: ordered.  Risk Prescription  drug management.   Presents to the ED for her large laceration to the right anterior shin.  I ordered interpreted x-ray which is negative for visual foreign bodies or acute fracture.  Pressure irrigation performed. Wound explored and base of wound visualized in a bloodless field without evidence of foreign body.  Laceration occurred < 8 hours prior to repair which was well tolerated.  Tdap updated.  Patient had 3 horizontal sutures placed for skin support, 1 on either side and then one at the tip.  16 placed in between pt has  no comorbidities to effect normal wound healing. Pt discharged  without antibiotics.  Discussed suture home care with patient and answered questions. Pt to follow-up for wound check and suture removal in 7-10 days; they are to return to the ED sooner for signs of infection. Pt is hemodynamically stable with no complaints prior to dc.   Final Clinical Impression(s) / ED Diagnoses Final diagnoses:  Leg laceration, right, initial encounter    Rx / DC Orders ED Discharge Orders     None         Delight Ovens 07/08/22 2229    Pricilla Loveless, MD 07/12/22 [phone]

## 2022-07-08 NOTE — Discharge Instructions (Signed)
1. Medications: Tylenol or ibuprofen for pain, usual home medications 2. Treatment: ice for swelling, keep wound clean with warm soap and water and keep bandage dry, do not submerge in water for 24 hours 3. Follow Up: Please return or follow up with primary care provider in 7-10 days to have your stitches/staples removed or sooner if you have concerns. Return to the emergency department for increased redness, drainage of pus from the wound, or fevers/chills.   WOUND CARE  Keep area clean and dry for 24 hours. Do not remove bandage, if applied.  After 24 hours, remove bandage and wash wound gently with mild soap and warm water. Reapply a new bandage after cleaning wound, if directed.   Continue daily cleansing with soap and water until stitches/staples are removed.  Do not apply any ointments or creams to the wound while stitches/staples are in place, as this may cause delayed healing. Return if you experience any of the following signs of infection: Swelling, redness, pus drainage, streaking, fever >101.0 F  Return if you experience excessive bleeding that does not stop after 15-20 minutes of constant, firm pressure.  

## 2022-07-08 NOTE — ED Triage Notes (Signed)
Pt presents to ED with laceration to right leg, pt states he missed step of tractor

## 2022-07-08 NOTE — ED Notes (Signed)
Pt returned from xray

## 2022-07-12 ENCOUNTER — Ambulatory Visit: Payer: BLUE CROSS/BLUE SHIELD | Admitting: Orthopedic Surgery

## 2022-07-12 ENCOUNTER — Encounter: Payer: Self-pay | Admitting: Orthopedic Surgery

## 2022-07-12 ENCOUNTER — Telehealth: Payer: Self-pay | Admitting: Orthopedic Surgery

## 2022-07-12 VITALS — BP 146/93 | HR 118 | Ht 70.0 in | Wt 165.0 lb

## 2022-07-12 DIAGNOSIS — S81811A Laceration without foreign body, right lower leg, initial encounter: Secondary | ICD-10-CM

## 2022-07-12 DIAGNOSIS — S81812A Laceration without foreign body, left lower leg, initial encounter: Secondary | ICD-10-CM | POA: Diagnosis not present

## 2022-07-12 MED ORDER — DOXYCYCLINE HYCLATE 100 MG PO TABS: 100.0000 mg | ORAL_TABLET | Freq: Two times a day (BID) | ORAL | 1 refills | Status: DC

## 2022-07-12 NOTE — Progress Notes (Signed)
Chief Complaint  Patient presents with   Knee Injury    Left knee laceration  07/08/22    HPI: She had was helping someone on a tractor he missed a step his leg went in between 2 pieces of metal and he lacerated his left leg.  He went to the ER they washed it out they stapled together but he was not put on antibiotics  He comes in today not really in a lot of pain and issue palpate the area at the distal end of the V-shaped flap and the lateral side of his leg  There is some erythema there.  There is some swelling.  Past Medical History:  Diagnosis Date   Hypertension     BP (!) 146/93   Pulse (!) 118   Ht 5\' 10"  (1.778 m)   Wt 165 lb (74.8 kg)   BMI 23.68 kg/m    General appearance: Well-developed well-nourished no gross deformities  Cardiovascular normal pulse and perfusion normal color without edema  Neurologically no sensation loss or deficits or pathologic reflexes  Psychological: Awake alert and oriented x3 mood and affect normal   Musculoskeletal: See the pictures in the media.  He basically has a V-shaped laceration in the inferior portion at the apex looks like it is going to necrosis and some erythema laterally and distal to the wound and then right at the apex of the wound there is some tenderness there I do not feel any fluctuance to suggest infection  Imaging I reviewed his image my report is that there is no fracture there is some soft tissue gas from the laceration  A/P  Laceration Encounter Diagnosis  Name Primary?   Laceration of right lower leg, initial encounter Yes   Recommend doxycycline follow-up in 3 days to see if the doxycycline is improving the erythema  Unclear which direction this will go whether this needs to be taken back to the operating room for formal irrigation  Meds ordered this encounter  Medications   doxycycline (VIBRA-TABS) 100 MG tablet    Sig: Take 1 tablet (100 mg total) by mouth 2 (two) times daily.    Dispense:  28 tablet     Refill:  1

## 2022-07-12 NOTE — Telephone Encounter (Signed)
Patient requests note for work, which we offered at time of check out from today's appointment. Patient/wife called, said he spoke with employer and that they need a note to clear him. Patient would like to return to work tomorrow. Please advise.

## 2022-07-12 NOTE — Telephone Encounter (Signed)
Patient aware. Note issued.

## 2022-07-12 NOTE — Telephone Encounter (Signed)
Ok

## 2022-07-12 NOTE — Telephone Encounter (Signed)
ok 

## 2022-07-13 DIAGNOSIS — S81812A Laceration without foreign body, left lower leg, initial encounter: Secondary | ICD-10-CM | POA: Insufficient documentation

## 2022-07-15 ENCOUNTER — Encounter: Payer: Self-pay | Admitting: Orthopedic Surgery

## 2022-07-15 ENCOUNTER — Ambulatory Visit: Payer: BLUE CROSS/BLUE SHIELD | Admitting: Orthopedic Surgery

## 2022-07-15 DIAGNOSIS — S81812D Laceration without foreign body, left lower leg, subsequent encounter: Secondary | ICD-10-CM

## 2022-07-15 NOTE — Patient Instructions (Signed)
Continue antibiotics

## 2022-07-15 NOTE — Progress Notes (Signed)
FOLLOW UP   Encounter Diagnosis  Name Primary?   Laceration of left lower extremity, subsequent encounter 07/08/22 Yes     Chief Complaint  Patient presents with   Laceration    Lower leg left 07/08/22     Please see photographs  Currently on doxycycline  Seems to be handling things well.  Remove sutures x3  Start with every other staple to be removed  Continue antibiotics

## 2022-07-19 ENCOUNTER — Ambulatory Visit: Payer: BLUE CROSS/BLUE SHIELD | Admitting: Orthopedic Surgery

## 2022-07-19 ENCOUNTER — Encounter: Payer: Self-pay | Admitting: Orthopedic Surgery

## 2022-07-19 DIAGNOSIS — S81812D Laceration without foreign body, left lower leg, subsequent encounter: Secondary | ICD-10-CM | POA: Diagnosis not present

## 2022-07-19 NOTE — Progress Notes (Signed)
Chief Complaint  Patient presents with   Knee Pain    Left lower leg laceration  DOI 07/08/22   11 days post leg laceration  Still has some erythema just at the apex of the flap  Staples out Steri-Strips applied continue doxycycline follow-up in a week

## 2022-07-26 ENCOUNTER — Ambulatory Visit (INDEPENDENT_AMBULATORY_CARE_PROVIDER_SITE_OTHER): Payer: BLUE CROSS/BLUE SHIELD | Admitting: Orthopedic Surgery

## 2022-07-26 ENCOUNTER — Encounter: Payer: Self-pay | Admitting: Orthopedic Surgery

## 2022-07-26 DIAGNOSIS — S81812D Laceration without foreign body, left lower leg, subsequent encounter: Secondary | ICD-10-CM

## 2022-07-26 MED ORDER — CIPROFLOXACIN HCL 500 MG PO TABS: 500.0000 mg | ORAL_TABLET | Freq: Two times a day (BID) | ORAL | 1 refills | Status: DC

## 2022-07-26 NOTE — Progress Notes (Signed)
Chief Complaint  Patient presents with   Laceration    07/08/22     Laceration of the leg  Left lower leg laceration on doxycycline since July 13 still has some redness and erythema around the apex of the wound where the vascularity is in question  No fluctuance but he said it did drain some  All staples and Steri-Strips have been removed prior to this visit  He is allergic to penicillin.  He did not get antibiotics in the ER.  I want him to continue to do oxacillin for the staph coverage but I am going to add Cipro to try to cover for anaerobic bacteria since he cannot take penicillin  Recommend he continue this doxycycline and then see [place] in 1 week  Caution to come back sooner if his pain or the erythema spreads  Meds ordered this encounter  Medications   ciprofloxacin (CIPRO) 500 MG tablet    Sig: Take 1 tablet (500 mg total) by mouth 2 (two) times daily.    Dispense:  20 tablet    Refill:  1

## 2022-07-26 NOTE — Patient Instructions (Signed)
Add   Meds ordered this encounter  Medications   ciprofloxacin (CIPRO) 500 MG tablet    Sig: Take 1 tablet (500 mg total) by mouth 2 (two) times daily.    Dispense:  20 tablet    Refill:  1

## 2022-08-02 ENCOUNTER — Encounter: Payer: BLUE CROSS/BLUE SHIELD | Admitting: Orthopedic Surgery

## 2022-08-05 ENCOUNTER — Ambulatory Visit: Payer: BLUE CROSS/BLUE SHIELD | Admitting: Orthopedic Surgery

## 2022-08-05 DIAGNOSIS — S81811D Laceration without foreign body, right lower leg, subsequent encounter: Secondary | ICD-10-CM

## 2022-08-05 DIAGNOSIS — S81812D Laceration without foreign body, left lower leg, subsequent encounter: Secondary | ICD-10-CM

## 2022-08-05 MED ORDER — CIPROFLOXACIN HCL 500 MG PO TABS: 500.0000 mg | ORAL_TABLET | Freq: Two times a day (BID) | ORAL | 1 refills | Status: DC

## 2022-08-05 NOTE — Progress Notes (Signed)
RIGHT LEG   Wound follow-up  Patient switched to Cipro  Patient's wound now looks better.  Just a little area of redness left of the skin flap looks like it will survive  Continue Cipro refill provided follow-up in a week

## 2022-08-12 ENCOUNTER — Ambulatory Visit: Payer: BLUE CROSS/BLUE SHIELD | Admitting: Orthopedic Surgery

## 2022-08-12 ENCOUNTER — Encounter: Payer: Self-pay | Admitting: Orthopedic Surgery

## 2022-08-12 DIAGNOSIS — S81811D Laceration without foreign body, right lower leg, subsequent encounter: Secondary | ICD-10-CM

## 2022-08-12 NOTE — Progress Notes (Signed)
Chief Complaint  Patient presents with   Wound Check    1 wk follow up RIGHT leg DOI 07/08/22   Encounter Diagnosis  Name Primary?   Laceration of right lower extremity, subsequent encounter Yes    Examination right leg wound.  Patient on Cipro  Cipro seems to have done much better than the other antibiotics he was on he is basically healed this wound now should not have any other issues he is released

## 2022-08-12 NOTE — Patient Instructions (Signed)
FINISH ANTIBIOTICS AND RETURN AS NEEDED

## 2022-08-19 ENCOUNTER — Ambulatory Visit: Payer: BLUE CROSS/BLUE SHIELD | Admitting: Orthopedic Surgery

## 2023-02-18 ENCOUNTER — Ambulatory Visit: Payer: BLUE CROSS/BLUE SHIELD | Admitting: Internal Medicine

## 2023-02-18 ENCOUNTER — Encounter: Payer: Self-pay | Admitting: Internal Medicine

## 2023-02-18 VITALS — BP 132/86 | HR 65 | Ht 70.0 in | Wt 159.8 lb

## 2023-02-18 DIAGNOSIS — K1370 Unspecified lesions of oral mucosa: Secondary | ICD-10-CM | POA: Diagnosis not present

## 2023-02-18 DIAGNOSIS — Z2821 Immunization not carried out because of patient refusal: Secondary | ICD-10-CM

## 2023-02-18 NOTE — Progress Notes (Unsigned)
Acute Office Visit  Subjective:     Patient ID: Andrew Ferguson, male    DOB: [birth year], 52 y.o.   MRN: [id number]  Chief Complaint  Patient presents with   Oral Pain    Place on the roof of mouth. Noticed after a dentist visit.   Andrew Ferguson presents today for an acute visit for evaluation of an oral lesion.  He will be establishing care at [facility].  He reports noticing a linear, hypopigmented lesion on the roof of his mouth after undergoing dental work 1 month ago.  He was having multiple teeth removed and was administered local anesthetic prior to starting the procedure.  Ever since this time he has had a lesion along the roof of his mouth.  He has a history of smokeless tobacco use and has been dipping tobacco for over 30 years.  He is concerned that this lesion is malignant.  He reports today that his dentist recommended that he see an ENT for further evaluation.  Review of Systems  HENT:         Oral lesion  All other systems reviewed and are negative.     Objective:    BP 132/86   Pulse 65   Ht '5\' 10"'$  (1.778 m)   Wt 159 lb 12.8 oz (72.5 kg)   SpO2 94%   BMI 22.93 kg/m   Physical Exam Vitals reviewed.  Constitutional:      General: He is not in acute distress.    Appearance: Normal appearance. He is not ill-appearing.  HENT:     Head: Normocephalic and atraumatic.     Right Ear: External ear normal.     Left Ear: External ear normal.     Nose: Nose normal.     Mouth/Throat:     Mouth: Mucous membranes are moist.     Pharynx: Oropharynx is clear. No oropharyngeal exudate or posterior oropharyngeal erythema.     Comments: There is a hypopigmented, linear lesion along the hard palate Eyes:     General: No scleral icterus.    Extraocular Movements: Extraocular movements intact.     Conjunctiva/sclera: Conjunctivae normal.     Pupils: Pupils are equal, round, and reactive to light.  Cardiovascular:     Rate and Rhythm: Normal rate and regular  rhythm.     Pulses: Normal pulses.     Heart sounds: Normal heart sounds. No murmur heard.    No friction rub. No gallop.  Pulmonary:     Effort: Pulmonary effort is normal.     Breath sounds: Normal breath sounds. No wheezing, rhonchi or rales.  Abdominal:     General: Abdomen is flat. Bowel sounds are normal. There is no distension.     Palpations: Abdomen is soft.     Tenderness: There is no abdominal tenderness.  Musculoskeletal:        General: Normal range of motion.     Cervical back: Normal range of motion.  Skin:    General: Skin is warm and dry.     Capillary Refill: Capillary refill takes less than 2 seconds.  Neurological:     General: No focal deficit present.     Mental Status: He is alert and oriented to person, place, and time.  Psychiatric:        Mood and Affect: Mood normal.        Behavior: Behavior normal.       Assessment & Plan:   Problem List Items  Addressed This Visit       Oral lesion    Presenting today for an acute visit for evaluation of an oral lesion along the hard palate that has been present for the past month.  The lesion was first noticed after he underwent dental work.  He has a 30-year history of smokeless tobacco use. -ENT referral placed today -He will return to care in 3 months      Relevant Orders   Ambulatory referral to ENT   Return in about 3 months (around 05/19/2023).  Johnette Abraham, MD

## 2023-02-18 NOTE — Patient Instructions (Signed)
It was a pleasure to see you today.  Thank you for giving [place] the opportunity to be involved in your care.  Below is a brief recap of your visit and next steps.  We will plan to see you again in 3 months.  Summary ENT referral placed Follow up in 3 months for new patient appointment

## 2023-02-21 ENCOUNTER — Encounter: Payer: Self-pay | Admitting: Internal Medicine

## 2023-02-21 DIAGNOSIS — K1370 Unspecified lesions of oral mucosa: Secondary | ICD-10-CM | POA: Insufficient documentation

## 2023-02-21 NOTE — Assessment & Plan Note (Signed)
Presenting today for an acute visit for evaluation of an oral lesion along the hard palate that has been present for the past month.  The lesion was first noticed after he underwent dental work.  He has a 30-year history of smokeless tobacco use. -ENT referral placed today -He will return to care in 3 months

## 2023-02-24 ENCOUNTER — Encounter: Payer: Self-pay | Admitting: Radiology

## 2023-02-28 DIAGNOSIS — R1313 Dysphagia, pharyngeal phase: Secondary | ICD-10-CM | POA: Insufficient documentation

## 2023-02-28 DIAGNOSIS — R220 Localized swelling, mass and lump, head: Secondary | ICD-10-CM | POA: Insufficient documentation

## 2023-05-19 ENCOUNTER — Encounter: Payer: Self-pay | Admitting: Internal Medicine

## 2023-05-19 ENCOUNTER — Ambulatory Visit (INDEPENDENT_AMBULATORY_CARE_PROVIDER_SITE_OTHER): Payer: BLUE CROSS/BLUE SHIELD | Admitting: Internal Medicine

## 2023-05-19 VITALS — BP 124/77 | HR 68 | Ht 70.0 in | Wt 156.0 lb

## 2023-05-19 DIAGNOSIS — Z1321 Encounter for screening for nutritional disorder: Secondary | ICD-10-CM

## 2023-05-19 DIAGNOSIS — Z789 Other specified health status: Secondary | ICD-10-CM

## 2023-05-19 DIAGNOSIS — Z13220 Encounter for screening for lipoid disorders: Secondary | ICD-10-CM | POA: Diagnosis not present

## 2023-05-19 DIAGNOSIS — Z0001 Encounter for general adult medical examination with abnormal findings: Secondary | ICD-10-CM | POA: Diagnosis not present

## 2023-05-19 DIAGNOSIS — Z131 Encounter for screening for diabetes mellitus: Secondary | ICD-10-CM | POA: Diagnosis not present

## 2023-05-19 DIAGNOSIS — Z1329 Encounter for screening for other suspected endocrine disorder: Secondary | ICD-10-CM

## 2023-05-19 DIAGNOSIS — Z720 Tobacco use: Secondary | ICD-10-CM

## 2023-05-19 DIAGNOSIS — Z1159 Encounter for screening for other viral diseases: Secondary | ICD-10-CM

## 2023-05-19 DIAGNOSIS — I10 Essential (primary) hypertension: Secondary | ICD-10-CM

## 2023-05-19 DIAGNOSIS — Z114 Encounter for screening for human immunodeficiency virus [HIV]: Secondary | ICD-10-CM

## 2023-05-19 NOTE — Patient Instructions (Addendum)
It was a pleasure to see you today.  Thank you for giving [place] the opportunity to be involved in your care.  Below is a brief recap of your visit and next steps.  We will plan to see you again in 6 months.  Summary We will request records from Dr. Daryel Gerald updated today Follow up in 6 months

## 2023-05-19 NOTE — Progress Notes (Signed)
Established Patient Office Visit  Subjective   Patient ID: [place] B Hams, male    DOB: 1971/03/20  Age: 52 y.o. MRN: [id number]  Chief Complaint  Patient presents with   Follow-up   Andrew Ferguson returns to care today for routine follow-up.  Last evaluated by me on 2/23 as a new patient presenting to establish care.  His acute concern at that time was an oral lesion on the roof of his mouth that had been present for the past month.  He was referred to ENT for evaluation.  97-month follow-up was arranged.  In the interim he was evaluated by ENT, who felt that the lesion was not concerning.  There have otherwise been no acute interval events.  Mr. Barrueta reports feeling well today.  He is asymptomatic and has no additional concerns to discuss.  He currently works for [employer] in [place].  He endorses current tobacco use, mostly dipping, but also smokes 1 pack/week of cigarettes and has been doing so for at least 20 years.  He additionally reports drinking 4-5 beers per day.  Denies illicit drug use.  He is unaware of any significant family medical history.  Past Medical History:  Diagnosis Date   Hypertension    Past Surgical History:  Procedure Laterality Date   TYMPANOSTOMY TUBE PLACEMENT Bilateral    Social History   Tobacco Use   Smoking status: Every Day    Packs/day: .25    Types: Cigarettes   Smokeless tobacco: Current    Types: Chew   Tobacco comments:    3 cigarettes per day  Vaping Use   Vaping Use: Never used  Substance Use Topics   Alcohol use: Yes    Comment: 6-7 beers every other day    Drug use: No   Family History  Problem Relation Age of Onset   Healthy Mother    Healthy Father    Allergies  Allergen Reactions   Carrot Oil Hives   Codeine Swelling   Penicillins Swelling   Review of Systems  Constitutional:  Negative for chills and fever.  HENT:  Negative for sore throat.   Respiratory:  Negative for cough and shortness of breath.   Cardiovascular:   Negative for chest pain, palpitations and leg swelling.  Gastrointestinal:  Negative for abdominal pain, blood in stool, constipation, diarrhea, nausea and vomiting.  Genitourinary:  Negative for dysuria and hematuria.  Musculoskeletal:  Negative for myalgias.  Skin:  Negative for itching and rash.  Neurological:  Negative for dizziness and headaches.  Psychiatric/Behavioral:  Negative for depression and suicidal ideas.      Objective:     BP 124/77   Pulse 68   Ht 5\' 10"  (1.778 m)   Wt 156 lb (70.8 kg)   SpO2 93%   BMI 22.38 kg/m  BP Readings from Last 3 Encounters:  05/19/23 124/77  02/18/23 132/86  07/12/22 (!) 146/93   Physical Exam Vitals reviewed.  Constitutional:      General: He is not in acute distress.    Appearance: Normal appearance. He is not ill-appearing.  HENT:     Head: Normocephalic and atraumatic.     Right Ear: External ear normal.     Left Ear: External ear normal.     Nose: Nose normal. No congestion or rhinorrhea.     Mouth/Throat:     Mouth: Mucous membranes are moist.     Pharynx: Oropharynx is clear.  Eyes:     General: No scleral icterus.  Extraocular Movements: Extraocular movements intact.     Conjunctiva/sclera: Conjunctivae normal.     Pupils: Pupils are equal, round, and reactive to light.  Cardiovascular:     Rate and Rhythm: Normal rate and regular rhythm.     Pulses: Normal pulses.     Heart sounds: Normal heart sounds. No murmur heard. Pulmonary:     Effort: Pulmonary effort is normal.     Breath sounds: Normal breath sounds. No wheezing, rhonchi or rales.  Abdominal:     General: Abdomen is flat. Bowel sounds are normal. There is no distension.     Palpations: Abdomen is soft.     Tenderness: There is no abdominal tenderness.  Musculoskeletal:        General: No swelling or deformity. Normal range of motion.     Cervical back: Normal range of motion.  Skin:    General: Skin is warm and dry.     Capillary Refill:  Capillary refill takes less than 2 seconds.  Neurological:     General: No focal deficit present.     Mental Status: He is alert and oriented to person, place, and time.     Motor: No weakness.  Psychiatric:        Mood and Affect: Mood normal.        Behavior: Behavior normal.        Thought Content: Thought content normal.      Assessment & Plan:   Problem List Items Addressed This Visit       Essential hypertension    He endorses a past medical history significant for essential hypertension.  BP today is 124/77.  He is currently prescribed lisinopril 10 mg daily and nebivolol 5 mg daily. -No medication changes today.  Continue current antihypertensive medication regimen.      Current tobacco use    He endorses current smokeless tobacco and cigarette use.  Reports dipping daily and smokes roughly 1 pack/week of cigarettes.  He has been doing so for at least 1 years and is currently precontemplative with regard to cessation. -The patient was counseled on the dangers of tobacco use, and was advised to quit and reluctant to quit.  Reviewed strategies to maximize success, including removing cigarettes and smoking materials from environment, stress management, substitution of other forms of reinforcement, support of family/friends, and written materials.       Encounter for general adult medical examination with abnormal findings    Returning to care today for 56-month follow-up. -Baseline labs ordered today -Records from his previous PCP pending -We will plan for follow up in 6 months      Return in about 6 months (around 11/19/2023).   Billie Lade, MD

## 2023-05-20 ENCOUNTER — Other Ambulatory Visit: Payer: Self-pay | Admitting: Internal Medicine

## 2023-05-20 DIAGNOSIS — R7303 Prediabetes: Secondary | ICD-10-CM

## 2023-05-20 LAB — VITAMIN D 25 HYDROXY (VIT D DEFICIENCY, FRACTURES): Vit D, 25-Hydroxy: 27.1 ng/mL — ABNORMAL LOW (ref 30.0–100.0)

## 2023-05-20 LAB — CBC WITH DIFFERENTIAL/PLATELET
Basophils Absolute: 0 10*3/uL (ref 0.0–0.2)
Basos: 1 %
EOS (ABSOLUTE): 0.1 10*3/uL (ref 0.0–0.4)
Eos: 4 %
Hematocrit: 46.8 % (ref 37.5–51.0)
Hemoglobin: 15.7 g/dL (ref 13.0–17.7)
Immature Grans (Abs): 0 10*3/uL (ref 0.0–0.1)
Immature Granulocytes: 0 %
Lymphocytes Absolute: 1.5 10*3/uL (ref 0.7–3.1)
Lymphs: 41 %
MCH: 32.6 pg (ref 26.6–33.0)
MCHC: 33.5 g/dL (ref 31.5–35.7)
MCV: 97 fL (ref 79–97)
Monocytes Absolute: 0.3 10*3/uL (ref 0.1–0.9)
Monocytes: 9 %
Neutrophils Absolute: 1.7 10*3/uL (ref 1.4–7.0)
Neutrophils: 45 %
Platelets: 222 10*3/uL (ref 150–450)
RBC: 4.82 x10E6/uL (ref 4.14–5.80)
RDW: 11.9 % (ref 11.6–15.4)
WBC: 3.7 10*3/uL (ref 3.4–10.8)

## 2023-05-20 LAB — TSH+FREE T4
Free T4: 1.31 ng/dL (ref 0.82–1.77)
TSH: 0.61 u[IU]/mL (ref 0.450–4.500)

## 2023-05-20 LAB — LIPID PANEL
Chol/HDL Ratio: 1.8 ratio (ref 0.0–5.0)
Cholesterol, Total: 173 mg/dL (ref 100–199)
HDL: 96 mg/dL (ref >39)
LDL Chol Calc (NIH): 64 mg/dL (ref 0–99)
Triglycerides: 69 mg/dL (ref 0–149)
VLDL Cholesterol Cal: 13 mg/dL (ref 5–40)

## 2023-05-20 LAB — CMP14+EGFR
ALT: 40 [IU]/L (ref 0–44)
AST: 30 [IU]/L (ref 0–40)
Albumin/Globulin Ratio: 2.8 — ABNORMAL HIGH (ref 1.2–2.2)
Albumin: 4.8 g/dL (ref 3.8–4.9)
Alkaline Phosphatase: 78 [IU]/L (ref 44–121)
BUN/Creatinine Ratio: 8 — ABNORMAL LOW (ref 9–20)
BUN: 6 mg/dL (ref 6–24)
Bilirubin Total: 1.1 mg/dL (ref 0.0–1.2)
CO2: 24 mmol/L (ref 20–29)
Calcium: 9.3 mg/dL (ref 8.7–10.2)
Chloride: 102 mmol/L (ref 96–106)
Creatinine, Ser: 0.74 mg/dL — ABNORMAL LOW (ref 0.76–1.27)
Globulin, Total: 1.7 g/dL (ref 1.5–4.5)
Glucose: 94 mg/dL (ref 70–99)
Potassium: 4.5 mmol/L (ref 3.5–5.2)
Sodium: 141 mmol/L (ref 134–144)
Total Protein: 6.5 g/dL (ref 6.0–8.5)
eGFR: 108 mL/min/{1.73_m2}

## 2023-05-20 LAB — B12 AND FOLATE PANEL
Folate: 8.2 ng/mL
Vitamin B-12: 305 pg/mL (ref 232–1245)

## 2023-05-20 LAB — HCV INTERPRETATION

## 2023-05-20 LAB — HEMOGLOBIN A1C
Est. average glucose Bld gHb Est-mCnc: 137 mg/dL
Hgb A1c MFr Bld: 6.4 % — ABNORMAL HIGH (ref 4.8–5.6)

## 2023-05-20 LAB — HIV ANTIBODY (ROUTINE TESTING W REFLEX): HIV Screen 4th Generation wRfx: NONREACTIVE

## 2023-05-20 LAB — HCV AB W REFLEX TO QUANT PCR: HCV Ab: NONREACTIVE

## 2023-05-20 MED ORDER — METFORMIN HCL ER 500 MG PO TB24: 500.0000 mg | ORAL_TABLET | Freq: Two times a day (BID) | ORAL | 5 refills | Status: DC

## 2023-05-25 DIAGNOSIS — Z720 Tobacco use: Secondary | ICD-10-CM | POA: Insufficient documentation

## 2023-05-25 DIAGNOSIS — I10 Essential (primary) hypertension: Secondary | ICD-10-CM | POA: Insufficient documentation

## 2023-05-25 DIAGNOSIS — Z0001 Encounter for general adult medical examination with abnormal findings: Secondary | ICD-10-CM | POA: Insufficient documentation

## 2023-05-25 NOTE — Assessment & Plan Note (Signed)
Returning to care today for 9-month follow-up. -Baseline labs ordered today -Records from his previous PCP pending -We will plan for follow up in 6 months

## 2023-05-25 NOTE — Assessment & Plan Note (Signed)
He endorses a past medical history significant for essential hypertension.  BP today is 124/77.  He is currently prescribed lisinopril 10 mg daily and nebivolol 5 mg daily. -No medication changes today.  Continue current antihypertensive medication regimen.

## 2023-05-25 NOTE — Assessment & Plan Note (Signed)
He endorses current smokeless tobacco and cigarette use.  Reports dipping daily and smokes roughly 1 pack/week of cigarettes.  He has been doing so for at least 1 years and is currently precontemplative with regard to cessation. -The patient was counseled on the dangers of tobacco use, and was advised to quit and reluctant to quit.  Reviewed strategies to maximize success, including removing cigarettes and smoking materials from environment, stress management, substitution of other forms of reinforcement, support of family/friends, and written materials.

## 2023-06-28 ENCOUNTER — Other Ambulatory Visit: Payer: Self-pay

## 2023-06-28 DIAGNOSIS — I10 Essential (primary) hypertension: Secondary | ICD-10-CM

## 2023-06-28 MED ORDER — NEBIVOLOL HCL 5 MG PO TABS: 5.0000 mg | ORAL_TABLET | Freq: Every day | ORAL | 3 refills | Status: DC

## 2023-06-28 MED ORDER — LISINOPRIL 10 MG PO TABS: 10.0000 mg | ORAL_TABLET | Freq: Every day | ORAL | 3 refills | Status: DC

## 2023-06-29 ENCOUNTER — Other Ambulatory Visit (INDEPENDENT_AMBULATORY_CARE_PROVIDER_SITE_OTHER): Payer: BLUE CROSS/BLUE SHIELD

## 2023-06-29 ENCOUNTER — Encounter: Payer: Self-pay | Admitting: Orthopaedic Surgery

## 2023-06-29 ENCOUNTER — Ambulatory Visit: Payer: BLUE CROSS/BLUE SHIELD | Admitting: Orthopaedic Surgery

## 2023-06-29 ENCOUNTER — Telehealth: Payer: Self-pay | Admitting: Orthopaedic Surgery

## 2023-06-29 VITALS — BP 117/80 | HR 78 | Ht 70.0 in | Wt 156.0 lb

## 2023-06-29 DIAGNOSIS — M25432 Effusion, left wrist: Secondary | ICD-10-CM

## 2023-06-29 DIAGNOSIS — M25532 Pain in left wrist: Secondary | ICD-10-CM

## 2023-06-29 DIAGNOSIS — M25531 Pain in right wrist: Secondary | ICD-10-CM

## 2023-06-29 NOTE — Progress Notes (Addendum)
He has had swelling of the left nondominant wrist over the base of the thumb for several months getting slowly bigger.  He denies any trauma.  He also had a tick bite a week ago and some swelling of the mid forearm that is not red or itchy.  Left thumb has good ROM.  He has a diffuse swelling of the area of the first and second comparments area the size of a small grape, not firm, not red, not painful.  NV intact.  X-rays were done of the left wrist, reported separately.  He has changes in the navicular scaphoid with cyst formation and DJD changes.  Encounter Diagnosis  Name Primary?   Pain and swelling of left wrist Yes     I will get MRI of the wrist, navicular area.    Return in three weeks or after he gets the MRI.  Call if any problem.  Precautions discussed.  Electronically Signed Darreld Mclean, MD 7/3/202410:56 AM

## 2023-06-29 NOTE — Patient Instructions (Signed)
Central scheduling [phone] please schedule and call office to give date of MRI

## 2023-06-29 NOTE — Telephone Encounter (Signed)
Dr. Sanjuan Dame pt - [facility] left a message [phone] stating the pt has a MRI lt wrist 7/31, mentioned metal in his eyes a few years back, they need an order for an x-ray of the orbits so this can be done before the MRI to make sure there's not any metal in the eyes

## 2023-07-18 ENCOUNTER — Other Ambulatory Visit: Payer: Self-pay | Admitting: Internal Medicine

## 2023-07-18 ENCOUNTER — Telehealth: Payer: Self-pay

## 2023-07-18 DIAGNOSIS — Z87892 Personal history of anaphylaxis: Secondary | ICD-10-CM

## 2023-07-18 MED ORDER — EPINEPHRINE 0.3 MG/0.3ML IJ SOAJ: 0.3000 mg | INTRAMUSCULAR | 0 refills | Status: DC | PRN

## 2023-07-18 NOTE — Transitions of Care (Post Inpatient/ED Visit) (Signed)
   07/18/2023  Name: Andrew Ferguson MRN: [id number] DOB: 12-18-1971  Today's TOC FU Call Status: Today's TOC FU Call Status:: Successful TOC FU Call Competed TOC FU Call Complete Date: 07/18/23  Transition Care Management Follow-up Telephone Call Date of Discharge: 07/15/23 Discharge Facility: Other ([facility]) Name of Other ([facility]) Discharge Facility: [facility] Type of Discharge: Emergency Department Reason for ED Visit: Other: (Allergic Reaction) How have you been since you were released from the hospital?: Better Any questions or concerns?: No  Items Reviewed: Did you receive and understand the discharge instructions provided?: Yes Medications obtained,verified, and reconciled?: Yes (Medications Reviewed) Any new allergies since your discharge?: No Dietary orders reviewed?: Yes Do you have support at home?: Yes  Medications Reviewed Today: Medications Reviewed Today     Reviewed by Merleen Nicely, LPN (Licensed Practical Nurse) on 07/18/23 at 1400  Med List Status: <None>   Medication Order Taking? Sig Documenting Provider Last Dose Status Informant  EPINEPHrine (EPIPEN 2-PAK) 0.3 mg/0.3 mL IJ SOAJ injection [id number] Yes Inject 0.3 mg into the muscle as needed for anaphylaxis. Billie Lade, MD Taking Active   lisinopril (ZESTRIL) 10 MG tablet [id number] Yes Take 1 tablet (10 mg total) by mouth at bedtime. Billie Lade, MD Taking Active   metFORMIN (GLUCOPHAGE-XR) 500 MG 24 hr tablet [id number] Yes Take 1 tablet (500 mg total) by mouth 2 (two) times daily with a meal. Take once daily x 1 month, then increase to one tablet twice daily. Billie Lade, MD Taking Active   nebivolol (BYSTOLIC) 5 MG tablet [id number] Yes Take 1 tablet (5 mg total) by mouth daily. Billie Lade, MD Taking Active             Home Care and Equipment/Supplies: Were Home Health Services Ordered?: NA Any new equipment or medical supplies ordered?: NA  Functional  Questionnaire: Do you need assistance with bathing/showering or dressing?: No Do you need assistance with meal preparation?: No Do you need assistance with eating?: No Do you have difficulty maintaining continence: No Do you need assistance with getting out of bed/getting out of a chair/moving?: No Do you have difficulty managing or taking your medications?: No  Follow up appointments reviewed: PCP Follow-up appointment confirmed?: Yes Date of PCP follow-up appointment?: 07/19/23 Follow-up Provider: Dr [facility] Follow-up appointment confirmed?: No Do you need transportation to your follow-up appointment?: No Do you understand care options if your condition(s) worsen?: Yes-patient verbalized understanding    SIGNATURE  Woodfin Ganja LPN [facility] Nurse Health Advisor Direct Dial [phone]

## 2023-07-19 ENCOUNTER — Telehealth (INDEPENDENT_AMBULATORY_CARE_PROVIDER_SITE_OTHER): Payer: BLUE CROSS/BLUE SHIELD | Admitting: Internal Medicine

## 2023-07-19 ENCOUNTER — Encounter: Payer: Self-pay | Admitting: Internal Medicine

## 2023-07-19 DIAGNOSIS — T782XXA Anaphylactic shock, unspecified, initial encounter: Secondary | ICD-10-CM

## 2023-07-19 DIAGNOSIS — T7800XA Anaphylactic reaction due to unspecified food, initial encounter: Secondary | ICD-10-CM | POA: Insufficient documentation

## 2023-07-19 HISTORY — DX: Anaphylactic reaction due to unspecified food, initial encounter: T78.00XA

## 2023-07-19 NOTE — Progress Notes (Signed)
   Virtual Visit via Video Note  I connected with Andrew Ferguson on 07/19/23 at  8:20 AM EDT by a video enabled telemedicine application and verified that I am speaking with the correct person using two identifiers.  Patient Location: Home Provider Location: Office/Clinic  I discussed the limitations, risks, security, and privacy concerns of performing an evaluation and management service by video and the availability of in person appointments. I also discussed with the patient that there may be a patient responsible charge related to this service. The patient expressed understanding and agreed to proceed.  Subjective: PCP: Billie Lade, MD  Chief Complaint  Patient presents with   ER Follow Up   Andrew Ferguson has been evaluated through video encounter today for ER follow-up.  He presented to the emergency department at [facility] on 7/19 for an anaphylactic reaction.  He works at [employer] in [place] and developed a rash with significant itching.  He became diaphoretic and took Benadryl, suspecting that he was having an allergic reaction.  Andrew Ferguson became diaphoretic and EMS was called.  He remained hemodynamically stable.  Diffuse hives were noted on exam.  He was treated with Solu-Medrol, Benadryl, and IV fluids.  He symptomatically improved.  Discharged with prednisone and Pepcid x 5 days.  Andrew Ferguson has returned to work since this event.  He has not had any recurrence of symptoms.  He is interested in investigating exactly what triggered his symptoms.  He does not have additional concerns to discuss today.   ROS: Per HPI  Current Outpatient Medications:    EPINEPHrine (EPIPEN 2-PAK) 0.3 mg/0.3 mL IJ SOAJ injection, Inject 0.3 mg into the muscle as needed for anaphylaxis., Disp: 3 each, Rfl: 0   lisinopril (ZESTRIL) 10 MG tablet, Take 1 tablet (10 mg total) by mouth at bedtime., Disp: 90 tablet, Rfl: 3   metFORMIN (GLUCOPHAGE-XR) 500 MG 24 hr tablet, Take 1 tablet (500 mg total) by  mouth 2 (two) times daily with a meal. Take once daily x 1 month, then increase to one tablet twice daily., Disp: 60 tablet, Rfl: 5   nebivolol (BYSTOLIC) 5 MG tablet, Take 1 tablet (5 mg total) by mouth daily., Disp: 90 tablet, Rfl: 3  Assessment and Plan:  Anaphylaxis, initial encounter Assessment & Plan: Evaluated today for ER follow-up in the setting of recent ER presentation for anaphylactic reaction.  He developed diffuse hives and became diaphoretic.  Treated with steroids, Benadryl, and Pepcid.  He has not been able to identify what triggered his symptoms and would like to have this further investigated.  No recurrence of symptoms since returning to work. -EpiPen prescribed yesterday -Continue Pepcid and prednisone x 5 days -Allergy and immunology referral placed -He will return to care for previously scheduled follow-up in January   Follow Up Instructions: Return if symptoms worsen or fail to improve.   I discussed the assessment and treatment plan with the patient. The patient was provided an opportunity to ask questions, and all were answered. The patient agreed with the plan and demonstrated an understanding of the instructions.   The patient was advised to call back or seek an in-person evaluation if the symptoms worsen or if the condition fails to improve as anticipated.  The above assessment and management plan was discussed with the patient. The patient verbalized understanding of and has agreed to the management plan.   Billie Lade, MD

## 2023-07-19 NOTE — Assessment & Plan Note (Signed)
Evaluated today for ER follow-up in the setting of recent ER presentation for anaphylactic reaction.  He developed diffuse hives and became diaphoretic.  Treated with steroids, Benadryl, and Pepcid.  He has not been able to identify what triggered his symptoms and would like to have this further investigated.  No recurrence of symptoms since returning to work. -EpiPen was prescribed yesterday by me -Continue Pepcid and prednisone x 5 days -Allergy and immunology referral placed -He will return to care for previously scheduled follow-up in January

## 2023-07-27 ENCOUNTER — Ambulatory Visit (HOSPITAL_COMMUNITY)
Admission: RE | Admit: 2023-07-27 | Discharge: 2023-07-27 | Disposition: A | Discharge status: Home / Self Care | Payer: BLUE CROSS/BLUE SHIELD | Source: Ambulatory Visit | Attending: Orthopaedic Surgery | Admitting: Orthopaedic Surgery

## 2023-07-27 DIAGNOSIS — M25532 Pain in left wrist: Secondary | ICD-10-CM | POA: Insufficient documentation

## 2023-07-27 DIAGNOSIS — M25432 Effusion, left wrist: Secondary | ICD-10-CM | POA: Insufficient documentation

## 2023-08-10 ENCOUNTER — Ambulatory Visit: Payer: BLUE CROSS/BLUE SHIELD | Admitting: Orthopaedic Surgery

## 2023-08-10 ENCOUNTER — Encounter: Payer: Self-pay | Admitting: Orthopaedic Surgery

## 2023-08-10 VITALS — BP 117/80 | Ht 70.0 in | Wt 156.0 lb

## 2023-08-10 DIAGNOSIS — M25432 Effusion, left wrist: Secondary | ICD-10-CM | POA: Diagnosis not present

## 2023-08-10 DIAGNOSIS — M25532 Pain in left wrist: Secondary | ICD-10-CM | POA: Diagnosis not present

## 2023-08-10 NOTE — Progress Notes (Signed)
My wrist is hurting more.  He has pain and swelling and tenderness with use of the left wrist.  He had MRI done which showed: IMPRESSION: 1. Tenosynovitis of compartment 1 and 2 extensor tendons and the extensor pollicis longus tendon at and just proximal to the diaphysis of the radius most consistent with intersection syndrome. Fluid in the tendon sheaths is deep to a marker placed over the region of concern. 2. Radiocarpal and STT osteoarthritis. 3. Susceptibility artifact about the head and neck of the second metacarpal is likely due to 2 punctate metallic foreign bodies seen on the plain films.  I have explained the findings to him.  I will have him see the hand surgeon.  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  He has swelling of the dorsal left wrist over the radial side and has pain with resisted use of thumb and wrist.  NV intact.  Swelling is size of two grapes.  There is no redness.   Encounter Diagnosis  Name Primary?   Pain and swelling of left wrist Yes   To see hand surgeon.  Call if any problem.  Precautions discussed.  Electronically Signed Darreld Mclean, MD 8/14/20248:13 AM

## 2023-08-19 ENCOUNTER — Ambulatory Visit: Payer: BLUE CROSS/BLUE SHIELD | Admitting: Allergy & Immunology

## 2023-08-19 ENCOUNTER — Encounter: Payer: Self-pay | Admitting: Allergy & Immunology

## 2023-08-19 VITALS — BP 116/84 | HR 72 | Temp 97.9°F | Resp 20 | Ht 70.5 in | Wt 161.2 lb

## 2023-08-19 DIAGNOSIS — J3089 Other allergic rhinitis: Secondary | ICD-10-CM | POA: Diagnosis not present

## 2023-08-19 DIAGNOSIS — J302 Other seasonal allergic rhinitis: Secondary | ICD-10-CM

## 2023-08-19 DIAGNOSIS — T7840XD Allergy, unspecified, subsequent encounter: Secondary | ICD-10-CM

## 2023-08-19 DIAGNOSIS — T7800XD Anaphylactic reaction due to unspecified food, subsequent encounter: Secondary | ICD-10-CM

## 2023-08-19 NOTE — Patient Instructions (Addendum)
1. Allergic reaction, subsequent encounter - Testing was not super revealing today,but maybe cockroaches are involved (maybe the coating that you inhaled was contaminated with cockroach allergens?). - Much of the things listed on the coating are vitamins and minerals, which are too small to cause an allergic reaction and definitely too small to test.  - We are going to get some lab work to look for stinging insect allergies and other rare causes of anaphylaxis. - EpiPen training reviewed. - Emergency plan provided. - We we will call you in 1 to 2 weeks for the results of the testing.   2. Seasonal and perennial allergic rhinitis - Testing today showed: grasses, trees, indoor molds, outdoor molds, dog, cockroach, and mixed feather - Copy of test results provided.  - Avoidance measures provided. - Continue with: an over-the-counter antihistamine as needed - We can certainly be more aggressive in the future if needed. - You can use an extra dose of the antihistamine, if needed, for breakthrough symptoms.  - Consider nasal saline rinses 1-2 times daily to remove allergens from the nasal cavities as well as help with mucous clearance (this is especially helpful to do before the nasal sprays are given) - Consider allergy shots as a means of long-term control. - Allergy shots "re-train" and "reset" the immune system to ignore environmental allergens and decrease the resulting immune response to those allergens (sneezing, itchy watery eyes, runny nose, nasal congestion, etc).    - Allergy shots improve symptoms in 75-85% of patients.  - We can discuss more at the next appointment if the medications are not working for you, but your symptoms at this point do not seem severe enough to warrant this.  3. Return in about 3 months (around 11/19/2023). You can have the follow up appointment with Dr. Dellis Anes or a Nurse Practicioner (our Nurse Practitioners are excellent and always have Physician oversight!).     Please inform us of any Emergency Department visits, hospitalizations, or changes in symptoms. Call us before going to the ED for breathing or allergy symptoms since we might be able to fit you in for a sick visit. Feel free to contact us anytime with any questions, problems, or concerns.  It was a pleasure to meet you today!  Websites that have reliable patient information: 1. American Academy of Asthma, Allergy, and Immunology: www.aaaai.org 2. Food Allergy Research and Education (FARE): foodallergy.org 3. Mothers of Asthmatics: http://www.asthmacommunitynetwork.org 4. American College of Allergy, Asthma, and Immunology: www.acaai.org   COVID-19 Vaccine Information can be found at: [email or web address] For questions related to vaccine distribution or appointments, please email [email or web address] or call [phone].   We realize that you might be concerned about having an allergic reaction to the COVID19 vaccines. To help with that concern, WE ARE OFFERING THE COVID19 VACCINES IN OUR OFFICE! Ask the front desk for dates!     "Like" [place] on Facebook and Instagram for our latest updates!      A healthy democracy works best when [employer] participate! Make sure you are registered to vote! If you have moved or changed any of your contact information, you will need to get this updated before voting! Scan the QR codes below to learn more!        Airborne Adult Perc - 08/19/23 0926     Time Antigen Placed 1324    Allergen Manufacturer Waynette Buttery    Location Back    Number of Test 55    1. Control-Buffer 50% Glycerol Negative  2. Control-Histamine 2+    3. Bahia Negative    4. [place] Negative    5. Johnson 2+    6. [place] Blue Negative    7. Meadow Fescue Negative    8. Perennial Rye Negative    9. Timothy Negative    10. Ragweed Mix Negative    11. Cocklebur Negative    12. Plantain,  English Negative    13.  Baccharis Negative    14. Dog Fennel Negative    15. Russian Thistle Negative    16. Lamb's Quarters Negative    17. Sheep Sorrell Negative    18. Rough Pigweed Negative    19. Marsh Elder, Rough Negative    20. Mugwort, Common Negative    21. Box, Elder Negative    22. Cedar, red 2+    23. Sweet Gum 2+    24. Pecan Pollen Negative    25. Pine Mix Negative    26. Walnut, Black Pollen Negative    27. Red Mulberry 2+    28. Ash Mix 2+    29. Birch Mix Negative    30. Beech American Negative    31. Cottonwood, [place] 2+    32. Hickory, White Negative    33. Maple Mix Negative    34. Oak, [place] Mix Negative    35. Sycamore Eastern Negative    36. Alternaria Alternata 2+    37. Cladosporium Herbarum 2+    38. Aspergillus Mix 2+    39. Penicillium Mix 2+    40. Bipolaris Sorokiniana (Helminthosporium) Negative    41. Drechslera Spicifera (Curvularia) Negative    42. Mucor Plumbeus 2+    43. Fusarium Moniliforme Negative    44. Aureobasidium Pullulans (pullulara) Negative    45. Rhizopus Oryzae Negative    46. Botrytis Cinera Negative    47. Epicoccum Nigrum Negative    48. Phoma Betae Negative    49. Dust Mite Mix Negative    50. Cat Hair 10,000 BAU/ml Negative    51.  Dog Epithelia 2+    52. Mixed Feathers 2+    53. Horse Epithelia Negative    54. Cockroach, German 3+    55. Tobacco Leaf Negative             Food Adult Perc - 08/19/23 0900     Time Antigen Placed 1610    Allergen Manufacturer Waynette Buttery    Location Back    Number of allergen test 17    1. Peanut Negative    2. Soybean Negative    3. Wheat Negative    4. Sesame Negative    5. Milk, Cow Negative    6. Casein Negative    7. Egg White, Chicken Negative    8. Shellfish Mix Negative    9. Fish Mix Negative    10. Cashew Negative    11. Walnut Food Negative    12. Almond Negative    13. Hazelnut Negative    14. Pecan Food Negative    15. Pistachio Negative    16. [place] Nut Negative    17.  Coconut Negative             Reducing Pollen Exposure  The American Academy of Allergy, Asthma and Immunology suggests the following steps to reduce your exposure to pollen during allergy seasons.    Do not hang sheets or clothing out to dry; pollen may collect on these items. Do not mow lawns or spend time around freshly  cut grass; mowing stirs up pollen. Keep windows closed at night.  Keep car windows closed while driving. Minimize morning activities outdoors, a time when pollen counts are usually at their highest. Stay indoors as much as possible when pollen counts or humidity is high and on windy days when pollen tends to remain in the air longer. Use air conditioning when possible.  Many air conditioners have filters that trap the pollen spores. Use a HEPA room air filter to remove pollen form the indoor air you breathe.    Control of Mold Allergen   Mold and fungi can grow on a variety of surfaces provided certain temperature and moisture conditions exist.  Outdoor molds grow on plants, decaying vegetation and soil.  The major outdoor mold, Alternaria and Cladosporium, are found in very high numbers during hot and dry conditions.  Generally, a late Summer - Fall peak is seen for common outdoor fungal spores.  Rain will temporarily lower outdoor mold spore count, but counts rise rapidly when the rainy period ends.  The most important indoor molds are Aspergillus and Penicillium.  Dark, humid and poorly ventilated basements are ideal sites for mold growth.  The next most common sites of mold growth are the bathroom and the kitchen.  Outdoor (Seasonal) Mold Control  Positive outdoor molds via skin testing: Alternaria, Cladosporium, and Mucor  Use air conditioning and keep windows closed Avoid exposure to decaying vegetation. Avoid leaf raking. Avoid grain handling. Consider wearing a face mask if working in moldy areas.    Indoor (Perennial) Mold Control   Positive indoor  molds via skin testing: Aspergillus and Penicillium  Maintain humidity below 50%. Clean washable surfaces with 5% bleach solution. Remove sources e.g. contaminated carpets.    Control of Cockroach Allergen  Cockroach allergen has been identified as an important cause of acute attacks of asthma, especially in urban settings.  There are fifty-five species of cockroach that exist in the [place], however only three, the [place], [place] species produce allergen that can affect patients with Asthma.  Allergens can be obtained from fecal particles, egg casings and secretions from cockroaches.    Remove food sources. Reduce access to water. Seal access and entry points. Spray runways with 0.5-1% Diazinon or Chlorpyrifos Blow boric acid power under stoves and refrigerator. Place bait stations (hydramethylnon) at feeding sites.  Control of Dog or Cat Allergen  Avoidance is the best way to manage a dog or cat allergy. If you have a dog or cat and are allergic to dog or cats, consider removing the dog or cat from the home. If you have a dog or cat but don't want to find it a new home, or if your family wants a pet even though someone in the household is allergic, here are some strategies that may help keep symptoms at bay:  Keep the pet out of your bedroom and restrict it to only a few rooms. Be advised that keeping the dog or cat in only one room will not limit the allergens to that room. Don't pet, hug or kiss the dog or cat; if you do, wash your hands with soap and water. High-efficiency particulate air (HEPA) cleaners run continuously in a bedroom or living room can reduce allergen levels over time. Regular use of a high-efficiency vacuum cleaner or a central vacuum can reduce allergen levels. Giving your dog or cat a bath at least once a week can reduce airborne allergen.  Allergy Shots  Allergies are the  result of a chain reaction that starts in the immune system. Your  immune system controls how your body defends itself. For instance, if you have an allergy to pollen, your immune system identifies pollen as an invader or allergen. Your immune system overreacts by producing antibodies called Immunoglobulin E (IgE). These antibodies travel to cells that release chemicals, causing an allergic reaction.  The concept behind allergy immunotherapy, whether it is received in the form of shots or tablets, is that the immune system can be desensitized to specific allergens that trigger allergy symptoms. Although it requires time and patience, the payback can be long-term relief. Allergy injections contain a dilute solution of those substances that you are allergic to based upon your skin testing and allergy history.   How Do Allergy Shots Work?  Allergy shots work much like a vaccine. Your body responds to injected amounts of a particular allergen given in increasing doses, eventually developing a resistance and tolerance to it. Allergy shots can lead to decreased, minimal or no allergy symptoms.  There generally are two phases: build-up and maintenance. Build-up often ranges from three to six months and involves receiving injections with increasing amounts of the allergens. The shots are typically given once or twice a week, though more rapid build-up schedules are sometimes used.  The maintenance phase begins when the most effective dose is reached. This dose is different for each person, depending on how allergic you are and your response to the build-up injections. Once the maintenance dose is reached, there are longer periods between injections, typically two to four weeks.  Occasionally doctors give cortisone-type shots that can temporarily reduce allergy symptoms. These types of shots are different and should not be confused with allergy immunotherapy shots.  Who Can Be Treated with Allergy Shots?  Allergy shots may be a good treatment approach for people with  allergic rhinitis (hay fever), allergic asthma, conjunctivitis (eye allergy) or stinging insect allergy.   Before deciding to begin allergy shots, you should consider:   The length of allergy season and the severity of your symptoms  Whether medications and/or changes to your environment can control your symptoms  Your desire to avoid long-term medication use  Time: allergy immunotherapy requires a major time commitment  Cost: may vary depending on your insurance coverage  Allergy shots for children age 14 and older are effective and often well tolerated. They might prevent the onset of new allergen sensitivities or the progression to asthma.  Allergy shots are not started on patients who are pregnant but can be continued on patients who become pregnant while receiving them. In some patients with other medical conditions or who take certain common medications, allergy shots may be of risk. It is important to mention other medications you talk to your allergist.   What are the two types of build-ups offered:   RUSH or Rapid Desensitization -- one day of injections lasting from 8:30-4:30pm, injections every 1 hour.  Approximately half of the build-up process is completed in that one day.  The following week, normal build-up is resumed, and this entails ~16 visits either weekly or twice weekly, until reaching your "maintenance dose" which is continued weekly until eventually getting spaced out to every month for a duration of 3 to 5 years. The regular build-up appointments are nurse visits where the injections are administered, followed by required monitoring for 30 minutes.    Traditional build-up -- weekly visits for 6 -12 months until reaching "maintenance dose", then continue weekly until  eventually spacing out to every 4 weeks as above. At these appointments, the injections are administered, followed by required monitoring for 30 minutes.     Either way is acceptable, and both are equally  effective. With the rush protocol, the advantage is that less time is spent here for injections overall AND you would also reach maintenance dosing faster (which is when the clinical benefit starts to become more apparent). Not everyone is a candidate for rapid desensitization.   IF we proceed with the RUSH protocol, there are premedications which must be taken the day before and the day after the rush only (this includes antihistamines, steroids, and Singulair).  After the rush day, no prednisone or Singulair is required, and we just recommend antihistamines taken on your injection day.  What Is An Estimate of the Costs?  If you are interested in starting allergy injections, please check with your insurance company about your coverage for both allergy vial sets and allergy injections.  Please do so prior to making the appointment to start injections.  The following are CPT codes to give to your insurance company. These are the amounts we BILL to the insurance company, but the amount YOU WILL PAY and WE RECEIVE IS SUBSTANTIALLY LESS and depends on the contracts we have with different insurance companies.   Amount Billed to Insurance One allergy vial set  CPT 95165   $ 1200     Two allergy vial set  CPT 95165   $ 2400     Three allergy vial set  CPT 95165   $ 3600     One injection   CPT 95115   $ 35  Two injections   CPT 95117   $ 40 RUSH (Rapid Desensitization) CPT 95180 x 8 hours $500/hour  Regarding the allergy injections, your co-pay may or may not apply with each injection, so please confirm this with your insurance company. When you start allergy injections, 1 or 2 sets of vials are made based on your allergies.  Not all patients can be on one set of vials. A set of vials lasts 6 months to a year depending on how quickly you can proceed with your build-up of your allergy injections. Vials are personalized for each patient depending on their specific allergens.  How often are allergy  injection given during the build-up period?   Injections are given at least weekly during the build-up period until your maintenance dose is achieved. Per the doctor's discretion, you may have the option of getting allergy injections two times per week during the build-up period. However, there must be at least 48 hours between injections. The build-up period is usually completed within 6-12 months depending on your ability to schedule injections and for adjustments for reactions. When maintenance dose is reached, your injection schedule is gradually changed to every two weeks and later to every three weeks. Injections will then continue every 4 weeks. Usually, injections are continued for a total of 3-5 years.   When Will I Feel Better?  Some may experience decreased allergy symptoms during the build-up phase. For others, it may take as long as 12 months on the maintenance dose. If there is no improvement after a year of maintenance, your allergist will discuss other treatment options with you.  If you aren't responding to allergy shots, it may be because there is not enough dose of the allergen in your vaccine or there are missing allergens that were not identified during your allergy testing. Other reasons  could be that there are high levels of the allergen in your environment or major exposure to non-allergic triggers like tobacco smoke.  What Is the Length of Treatment?  Once the maintenance dose is reached, allergy shots are generally continued for three to five years. The decision to stop should be discussed with your allergist at that time. Some people may experience a permanent reduction of allergy symptoms. Others may relapse and a longer course of allergy shots can be considered.  What Are the Possible Reactions?  The two types of adverse reactions that can occur with allergy shots are local and systemic. Common local reactions include very mild redness and swelling at the injection site,  which can happen immediately or several hours after. Report a delayed reaction from your last injection. These include arm swelling or runny nose, watery eyes or cough that occurs within 12-24 hours after injection. A systemic reaction, which is less common, affects the entire body or a particular body system. They are usually mild and typically respond quickly to medications. Signs include increased allergy symptoms such as sneezing, a stuffy nose or hives.   Rarely, a serious systemic reaction called anaphylaxis can develop. Symptoms include swelling in the throat, wheezing, a feeling of tightness in the chest, nausea or dizziness. Most serious systemic reactions develop within 30 minutes of allergy shots. This is why it is strongly recommended you wait in your doctor's office for 30 minutes after your injections. Your allergist is trained to watch for reactions, and his or her staff is trained and equipped with the proper medications to identify and treat them.   Report to the nurse immediately if you experience any of the following symptoms: swelling, itching or redness of the skin, hives, watery eyes/nose, breathing difficulty, excessive sneezing, coughing, stomach pain, diarrhea, or light headedness. These symptoms may occur within 15-20 minutes after injection and may require medication.   Who Should Administer Allergy Shots?  The preferred location for receiving shots is your prescribing allergist's office. Injections can sometimes be given at another facility where the physician and staff are trained to recognize and treat reactions, and have received instructions by your prescribing allergist.  What if I am late for an injection?   Injection dose will be adjusted depending upon how many days or weeks you are late for your injection.   What if I am sick?   Please report any illness to the nurse before receiving injections. She may adjust your dose or postpone injections depending on your  symptoms. If you have fever, flu, sinus infection or chest congestion it is best to postpone allergy injections until you are better. Never get an allergy injection if your asthma is causing you problems. If your symptoms persist, seek out medical care to get your health problem under control.  What If I am or Become Pregnant:  Women that become pregnant should schedule an appointment with [facility] before receiving any further allergy injections.

## 2023-08-19 NOTE — Progress Notes (Signed)
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 Allergic reaction - Testing was not super revealing today,but maybe cockroaches are involved (maybe the coating that you inhaled was contaminated with cockroach allergens?). - Much of the things listed on the coating are vitamins and minerals, which are too small to cause an allergic reaction and definitely too small to test.  - We are going to get some lab work to look for stinging insect allergies and other rare causes of anaphylaxis. - EpiPen training reviewed. - Emergency plan provided. - We we will call you in 1 to 2 weeks for the results of the testing.   2. Seasonal and perennial allergic rhinitis - Testing today showed: grasses, trees, indoor molds, outdoor molds, dog, cockroach, and mixed feather - Copy of test results provided.  - Avoidance measures provided. - Continue with: an over-the-counter antihistamine as needed - We can certainly be more aggressive in the future if needed. - You can use an extra dose of the antihistamine, if needed, for breakthrough symptoms.  - Consider nasal saline rinses 1-2 times daily to remove allergens from the nasal cavities as well as help with mucous clearance (this is especially helpful to do before the nasal sprays are given) - Consider allergy shots as a means of long-term control. - Allergy shots "re-train" and "reset" the immune system to ignore environmental allergens and decrease the resulting immune response to those allergens (sneezing, itchy watery eyes, runny nose, nasal congestion, etc).    - Allergy shots improve symptoms in 75-85% of patients.  - We can discuss more at the next appointment if the  medications are not working for you, but your symptoms at this point do not seem severe enough to warrant this.  3. Return in about 3 months (around 11/19/2023). You can have the follow up appointment with Dr. Dellis Anes or a Nurse Practicioner (our Nurse Practitioners are excellent and always have Physician oversight!).    This note in its entirety was forwarded to the Provider who requested this consultation.  Subjective:   Andrew Ferguson is a 52 y.o. male presenting today for evaluation of  Chief Complaint  Patient presents with   Allergic Reaction    Had a reaction at work about a month ago- started swelling, breaking out, passed out twice,BP dropped, throat was closing   Allergic Rhinitis     Andrew Ferguson has a history of the following: Patient Active Problem List   Diagnosis Date Noted   Anaphylactic reaction 07/19/2023   Essential hypertension 05/25/2023   Current tobacco use 05/25/2023   Palate mass 02/28/2023   Pharyngeal dysphagia 02/28/2023   Oral lesion 02/21/2023    History obtained from: chart review and patient.  Andrew B Eckrich was referred by Billie Lade, MD.     Andrew is a 52 y.o. male presenting for an evaluation of an allergic reaction .He works at [employer]. He started in March 2023.  He had a reaction at work on July 19th around [place]. 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 Some of the chemicals used in the process has a valve and it was blowing back throuh  the filter. He did not have a clue what was happening. He was sitting there with the door ope nand all of the fine kibble powder was blowing in him. He breathed in the coating for 30 minutes and he had a severe reaction.   He went to the ED and he passed out twice when he was in the ED He told his supervisor that he was leaving because of a reaction so someone from his work took him there. He was there until 1pm. He got  NaCl as well as benadryl, steroids, and famotidine.   He had monkey bread for breakfast that morning, without nuts. He did have a sausage biscuit around 5am. He did not do anything out of the ordinary. He has not been avoiding any particular foods since that time.  Ingredients:     He does not remember getting stung by anything at all. He was fine when he left the hospital. He did go back to work the next day and was fine. He worked Saturday and Sunday. 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 He is not sure whether this coated with the same thing.    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 He did go to the hospital for this event but the details are hazy. Review of the chart shows that this was May 2021. He presented with hives and itchiness over his entire body. He got steroids and famotidine. He also had a reaction with ED visit in September 2016 for hives.   He did go to see an allergist when he was a child. This was years ago and he does not remember the details. He was on allergy shots for a period of time back then.  Allergic Rhinitis Symptom History: He has environmental allergies symptom through the year. He does not really take anything for this. He might take an OTC antihistamine intermittently, but this is not a daily thing. He has been having sinus infections more recently the older that he gets. Grass is now bothering him.   Food Allergy Symptom History: He does react to carrots (swelling) that started 20 years ago. He never had an EpiPen. This is any carrot at all. He tolerates all of the major food allergens without a problem.    Otherwise, there is no history of other atopic diseases, including drug allergies, stinging insect allergies, or contact dermatitis. There is no significant infectious history. Vaccinations are up to date.    Past Medical  History: Patient Active Problem List   Diagnosis Date Noted   Anaphylactic reaction 07/19/2023   Essential hypertension 05/25/2023   Current tobacco use 05/25/2023   Palate mass 02/28/2023   Pharyngeal dysphagia 02/28/2023   Oral lesion 02/21/2023    Medication List:  Allergies as of 08/19/2023       Reactions   Carrot Oil Hives   Codeine Swelling   Penicillins Swelling        Medication List        Accurate as of August 19, 2023 11:31 AM. If you have any questions, ask your nurse or doctor.          EPINEPHrine 0.3 mg/0.3 mL Soaj injection Commonly known as: EpiPen 2-Pak Inject 0.3 mg into the muscle as needed for anaphylaxis.   lisinopril 10 MG tablet Commonly known as: ZESTRIL Take 1  tablet (10 mg total) by mouth at bedtime.   metFORMIN 500 MG 24 hr tablet Commonly known as: GLUCOPHAGE-XR Take 1 tablet (500 mg total) by mouth 2 (two) times daily with a meal. Take once daily x 1 month, then increase to one tablet twice daily.   nebivolol 5 MG tablet Commonly known as: BYSTOLIC Take 1 tablet (5 mg total) by mouth daily.        Birth History: non-contributory  Developmental History: Andrew has met all milestones on time. He has required nonon-contributory  Past Surgical History: Past Surgical History:  Procedure Laterality Date   TONSILLECTOMY     TYMPANOSTOMY TUBE PLACEMENT Bilateral      Family History: Family History  Problem Relation Age of Onset   Healthy Mother    Healthy Father    Allergic rhinitis Neg Hx    Angioedema Neg Hx    Asthma Neg Hx    Eczema Neg Hx      Social History: Andrew lives at home with his wife.  They live in a house.  There is wood laminate throughout the home.  They have a heat pump for heating and central cooling.  There are 3 dogs outside of the home.  There are no dust mite covers on the bedding.  There is no tobacco exposure.  He currently works in Production designer, theatre/television/film at the [employer]  There is fume, chemical,  and dust exposure in the home.  They do not use a HEPA filter.  They do not live near an interstate or industrial area.  He was an intermittent smoker, but has since quit.  He does chew tobacco.   Review of systems otherwise negative other than that mentioned in the HPI.    Objective:   Blood pressure 116/84, pulse 72, temperature 97.9 F (36.6 C), resp. rate 20, height 5' 10.5" (1.791 m), weight 161 lb 4 oz (73.1 kg), SpO2 96%. Body mass index is 22.81 kg/m.     Physical Exam Vitals reviewed.  Constitutional:      Appearance: He is well-developed.     Comments: Very pleasant.  Cooperative with the exam.  HENT:     Head: Normocephalic and atraumatic.     Right Ear: Tympanic membrane, ear canal and external ear normal. No drainage, swelling or tenderness. Tympanic membrane is not injected, scarred, erythematous, retracted or bulging.     Left Ear: Tympanic membrane, ear canal and external ear normal. No drainage, swelling or tenderness. Tympanic membrane is not injected, scarred, erythematous, retracted or bulging.     Nose: No nasal deformity, septal deviation, mucosal edema or rhinorrhea.     Right Turbinates: Enlarged, swollen and pale.     Left Turbinates: Enlarged, swollen and pale.     Right Sinus: No maxillary sinus tenderness or frontal sinus tenderness.     Left Sinus: No maxillary sinus tenderness or frontal sinus tenderness.     Mouth/Throat:     Mouth: Mucous membranes are not pale and not dry.     Pharynx: Uvula midline.  Eyes:     General: Allergic shiner present.        Right eye: No discharge.        Left eye: No discharge.     Conjunctiva/sclera: Conjunctivae normal.     Right eye: Right conjunctiva is not injected. No chemosis.    Left eye: Left conjunctiva is not injected. No chemosis.    Pupils: Pupils are equal, round, and reactive to light.  Cardiovascular:  Rate and Rhythm: Normal rate and regular rhythm.     Heart sounds: Normal heart sounds.   Pulmonary:     Effort: Pulmonary effort is normal. No tachypnea, accessory muscle usage or respiratory distress.     Breath sounds: Normal breath sounds. No wheezing, rhonchi or rales.  Chest:     Chest wall: No tenderness.  Abdominal:     Tenderness: There is no abdominal tenderness. There is no guarding or rebound.  Lymphadenopathy:     Head:     Right side of head: No submandibular, tonsillar or occipital adenopathy.     Left side of head: No submandibular, tonsillar or occipital adenopathy.     Cervical: No cervical adenopathy.  Skin:    Coloration: Skin is not pale.     Findings: No abrasion, erythema, petechiae or rash. Rash is not papular, urticarial or vesicular.  Neurological:     Mental Status: He is alert.  Psychiatric:        Behavior: Behavior is cooperative.      Diagnostic studies:   Allergy Studies:     Airborne Adult Perc - 08/19/23 0926     Time Antigen Placed 6213    Allergen Manufacturer Waynette Buttery    Location Back    Number of Test 55    1. Control-Buffer 50% Glycerol Negative    2. Control-Histamine 2+    3. Bahia Negative    4. French Southern Territories Negative    5. Johnson 2+    6. Kentucky Blue Negative    7. Meadow Fescue Negative    8. Perennial Rye Negative    9. Timothy Negative    10. Ragweed Mix Negative    11. Cocklebur Negative    12. Plantain,  English Negative    13. Baccharis Negative    14. Dog Fennel Negative    15. Russian Thistle Negative    16. Lamb's Quarters Negative    17. Sheep Sorrell Negative    18. Rough Pigweed Negative    19. Marsh Elder, Rough Negative    20. Mugwort, Common Negative    21. Box, Elder Negative    22. Cedar, red 2+    23. Sweet Gum 2+    24. Pecan Pollen Negative    25. Pine Mix Negative    26. Walnut, Black Pollen Negative    27. Red Mulberry 2+    28. Ash Mix 2+    29. Birch Mix Negative    30. Beech American Negative    31. Cottonwood, Guinea[place]iss[place] 2+    32. Hickory, White Negative    33. Maple Mix Negative     34. Oak, [place] Mix Negative    35. Sycamore Eastern Negative    36. Alternaria Alternata 2+    37. Cladosporium Herbarum 2+    38. Aspergillus Mix 2+    39. Penicillium Mix 2+    40. Bipolaris Sorokiniana (Helminthosporium) Negative    41. Drechslera Spicifera (Curvularia) Negative    42. Mucor Plumbeus 2+    43. Fusarium Moniliforme Negative    44. Aureobasidium Pullulans (pullulara) Negative    45. Rhizopus Oryzae Negative    46. Botrytis Cinera Negative    47. Epicoccum Nigrum Negative    48. Phoma Betae Negative    49. Dust Mite Mix Negative    50. Cat Hair 10,000 BAU/ml Negative    51.  Dog Epithelia 2+    52. Mixed Feathers 2+    53. Horse Epithelia Negative  54. Cockroach, German 3+    55. Tobacco Leaf Negative             Food Adult Perc - 08/19/23 0900     Time Antigen Placed 1610    Allergen Manufacturer Waynette Buttery    Location Back    Number of allergen test 17    1. Peanut Negative    2. Soybean Negative    3. Wheat Negative    4. Sesame Negative    5. Milk, Cow Negative    6. Casein Negative    7. Egg White, Chicken Negative    8. Shellfish Mix Negative    9. Fish Mix Negative    10. Cashew Negative    11. Walnut Food Negative    12. Almond Negative    13. Hazelnut Negative    14. Pecan Food Negative    15. Pistachio Negative    16. [place] Nut Negative    17. Coconut Negative             Allergy testing results were read and interpreted by myself, documented by clinical staff.         Malachi Bonds, MD [facility]

## 2023-08-22 ENCOUNTER — Encounter: Payer: Self-pay | Admitting: Allergy & Immunology

## 2023-08-24 LAB — ALPHA-GAL PANEL
Allergen Lamb IgE: 2.19 kU/L — AB
Beef IgE: 3.8 kU/L — AB
IgE (Immunoglobulin E), Serum: 420 [IU]/mL (ref 6–495)
O215-IgE Alpha-Gal: 51.8 kU/L — AB
Pork IgE: 0.44 kU/L — AB

## 2023-08-24 NOTE — Telephone Encounter (Signed)
Information on [facility] has been placed to be mailed out to the address on file.

## 2023-08-26 LAB — HYMENOPTERA VENOM ALLERGY PROF
I001-IgE Honeybee: 0.25 kU/L — AB
I003-IgE Yellow Jacket: 1.64 kU/L — AB
I004-IgE Paper Wasp: 1.91 kU/L — AB
I208-IgE Api m 1: <0.1 kU/L
I209-IgE Ves v 5: 0.46 kU/L — AB
I210-IgE Pol d 5: 0.49 kU/L — AB
I211-IgE Ves v 1: 0.79 kU/L — AB
I214-IgE Api m 2: 0.47 kU/L — AB
I215-IgE Api m 3: <0.1 kU/L
I216-IgE Api m 5: 0.14 kU/L — AB
I217-IgE Api m 10: <0.1 kU/L
Tryptase: 4.9 ug/L (ref 2.2–13.2)

## 2023-08-26 LAB — ALLERGEN COMPONENT COMMENTS

## 2023-09-01 ENCOUNTER — Telehealth: Payer: Self-pay

## 2023-09-01 LAB — KIT (D816V) DIGITAL PCR: CKIT Result: NEGATIVE

## 2023-09-01 LAB — TRYPTASE

## 2023-09-01 LAB — CHRONIC URTICARIA: cu index: 34.6 — ABNORMAL HIGH (ref <10)

## 2023-09-01 NOTE — Telephone Encounter (Signed)
Forwarding message for provider to review patient's lab results.

## 2023-09-02 NOTE — Telephone Encounter (Signed)
Done - thanks!   Malachi Bonds, MD [facility]

## 2023-09-05 ENCOUNTER — Ambulatory Visit: Payer: BLUE CROSS/BLUE SHIELD | Admitting: Orthopedic Surgery

## 2023-09-05 DIAGNOSIS — M19032 Primary osteoarthritis, left wrist: Secondary | ICD-10-CM

## 2023-09-05 NOTE — Progress Notes (Signed)
Italy B Leffel - 52 y.o. male MRN [id number]  Date of birth: [birth year]  Office Visit Note: Visit Date: 09/05/2023 PCP: Billie Lade, MD Referred by: Darreld Mclean, MD  Subjective: No chief complaint on file.  HPI: [place] B Kuba is a pleasant 51 y.o. male who presents today for evaluation of ongoing left wrist pain chronic in nature that is worsened over the past 3 months.  He has not undergone any formalized treatment.  Did undergo a prior x-ray and an MRI which did show tenosynovitis concerning for potential intersection syndrome.  X-rays also do show significant radiocarpal arthritis particularly at the radius scaphoid interface.  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  Pertinent ROS were reviewed with the patient and found to be negative unless otherwise specified above in HPI.   Visit Reason: Left Wrist Hand dominance: right Occupation: Maintenance Diabetic: No Heart/Lung History: High Blood Pressure  Blood Thinners: none  Prior Testing/EMG: xrays 06/29/23  /MRI 08/05/23 Injections (Date): none Treatments: none Prior Surgery: none  Assessment & Plan: Visit Diagnoses: No diagnosis found.  Plan: Extensive discussion was had the patient today regarding his left wrist.  I reviewed the results of his prior x-ray and MRI workup with him in detail today.  He does have significant arthritic change at the left radiocarpal interval, particular at the radius scaphoid interface.  This could be posttraumatic in nature given the appearance of the scaphoid on the x-ray.  As far as the intersection syndrome seen on the MRI, his clinical examination today is relatively mild for this, this would be managed conservatively anyway.  For the time being, given that he is not undergone any prior treatment modalities, we can begin with wrist bracing to be utilized as needed.  I did explain to him the underlying arthritic nature of the joint, we could consider potential cortisone injection to  the radiocarpal interface in the future for symptom relief.  Despite his significant radiocarpal arthritis, he has maintained relatively good motion of the left wrist.  From a surgical standpoint, he would be a reasonable candidate for proximal row carpectomy in the future should he have ongoing significant pain in this region that is refractory to conservative care.  He expressed understanding of this.  Wrist brace was given today.  He will see me back in approximate 6 weeks for recheck, possible discussion of cortisone injection at that time.  Follow-up: Return in about 6 weeks (around 10/17/2023).   Meds & Orders: No orders of the defined types were placed in this encounter.  No orders of the defined types were placed in this encounter.    Procedures: No procedures performed      Clinical History: No specialty comments available.  He reports that he has been smoking cigarettes. His smokeless tobacco use includes chew.  Recent Labs    05/19/23 0859  HGBA1C 6.4*    Objective:   Vital Signs: There were no vitals taken for this visit.  Physical Exam  Gen: Well-appearing, in no acute distress; non-toxic CV: Regular Rate. Well-perfused. Warm.  Resp: Breathing unlabored on room air; no wheezing. Psych: Fluid speech in conversation; appropriate affect; normal thought process  Ortho Exam Left wrist: - Notable swelling over the dorsal radial aspect of the left wrist, no significant erythema or warmth - Wrist flexion/extension left 45/40, right 65/55 - Full pronation and supination of the left wrist without significant restriction - Negative Finkelstein's left side, no significant tenderness over the intersection region -  Sensation intact in all distributions, hand is warm well-perfused  Imaging: No results found.  Past Medical/Family/Surgical/Social History: Medications & Allergies reviewed per EMR, new medications updated. Patient Active Problem List   Diagnosis Date Noted    Anaphylactic reaction 07/19/2023   Essential hypertension 05/25/2023   Current tobacco use 05/25/2023   Palate mass 02/28/2023   Pharyngeal dysphagia 02/28/2023   Oral lesion 02/21/2023   Past Medical History:  Diagnosis Date   Hypertension    Family History  Problem Relation Age of Onset   Healthy Mother    Healthy Father    Allergic rhinitis Neg Hx    Angioedema Neg Hx    Asthma Neg Hx    Eczema Neg Hx    Past Surgical History:  Procedure Laterality Date   TONSILLECTOMY     TYMPANOSTOMY TUBE PLACEMENT Bilateral    Social History   Occupational History   Not on file  Tobacco Use   Smoking status: Some Days    Current packs/day: 0.25    Types: Cigarettes   Smokeless tobacco: Current    Types: Chew   Tobacco comments:    Smokes cigarettes every now and then. Does Dip Tobacco-08/19/23  Vaping Use   Vaping status: Never Used  Substance and Sexual Activity   Alcohol use: Yes    Comment: 6-7 beers every other day    Drug use: No   Sexual activity: Not on file    Desiraye Rolfson Fara Boros) Denese Killings, M.D. [facility] 9:38 AM

## 2023-09-06 ENCOUNTER — Ambulatory Visit: Payer: BLUE CROSS/BLUE SHIELD | Admitting: Orthopedic Surgery

## 2023-09-08 ENCOUNTER — Encounter: Payer: Self-pay | Admitting: Family Medicine

## 2023-09-08 ENCOUNTER — Other Ambulatory Visit: Payer: Self-pay

## 2023-09-08 ENCOUNTER — Ambulatory Visit: Payer: BLUE CROSS/BLUE SHIELD | Admitting: Family Medicine

## 2023-09-08 ENCOUNTER — Encounter: Payer: Self-pay | Admitting: Internal Medicine

## 2023-09-08 DIAGNOSIS — Z91018 Allergy to other foods: Secondary | ICD-10-CM

## 2023-09-08 DIAGNOSIS — T7800XD Anaphylactic reaction due to unspecified food, subsequent encounter: Secondary | ICD-10-CM | POA: Diagnosis not present

## 2023-09-08 DIAGNOSIS — J3089 Other allergic rhinitis: Secondary | ICD-10-CM

## 2023-09-08 DIAGNOSIS — Z720 Tobacco use: Secondary | ICD-10-CM

## 2023-09-08 DIAGNOSIS — L500 Allergic urticaria: Secondary | ICD-10-CM | POA: Diagnosis not present

## 2023-09-08 DIAGNOSIS — Z91038 Other insect allergy status: Secondary | ICD-10-CM

## 2023-09-08 DIAGNOSIS — J302 Other seasonal allergic rhinitis: Secondary | ICD-10-CM | POA: Diagnosis not present

## 2023-09-08 NOTE — Progress Notes (Signed)
RE: Andrew B Trevino MRN: [id number] DOB: 1971/11/30 Date of Telemedicine Visit: 09/08/2023  Referring provider: Billie Lade, MD Primary care provider: Billie Lade, MD  Chief Complaint: Follow-up (Discuss lab results )   Telemedicine Follow Up Visit via Telephone: I connected with Andrew Ferguson for a follow up on 09/09/23 by telephone and verified that I am speaking with the correct person using two identifiers.   I discussed the limitations, risks, security and privacy concerns of performing an evaluation and management service by telephone and the availability of in person appointments. I also discussed with the patient that there may be a patient responsible charge related to this service. The patient expressed understanding and agreed to proceed.  Patient is at home Provider is at the office.  Visit start time: 243 Visit end time: 105 Insurance consent/check in by: [place] Medical consent and medical assistant/nurse: Ashleigh  History of Present Illness: He is a 52 y.o. male, who is being followed for urticaria, food allergy, stinging insect allergy, and allergic rhinitis. His previous allergy office visit was on 08/19/2023 with Dr. Dellis Anes.   At today's visit, he reports that he has not experienced any further episodes of urticaria.  He continues to avoid mammalian meats with no accidental ingestion or EpiPen use since his last visit.  He has tolerated cheese without adverse reaction.  We discussed in detail that his alpha gal panel was elevated and that he should avoid mammalian meats and have access to epinephrine autoinjector set at all times.  He is interested in returning to the clinic to update his alpha gal panel testing in 6 months.  We discussed in detail that his chronic urticaria panel was elevated indicating an autoimmune cause of hives.  We discussed that his hives may come and go over time.  He understands how to use the antihistamine ladder at this time.  He is  interested in starting Zyrtec once a day to prevent any hives.  He is interested in having written information regarding Xolair for hives for possible future use if hives are not well-controlled with antihistamines.  Allergic rhinitis is reported as moderately well-controlled with occasional nasal symptoms for which he continues Benadryl as needed.  He is not currently using a nonsedating antihistamine, nasal rinse, or nasal steroid spray.  His last environmental allergy testing via lab was on 08/19/2023 and was positive to grass pollen, tree pollen, mold, dog, cockroach, and feather.  He continues to avoid stinging insects with no stings since his last visit to this clinic.  His last hymenoptera panel was on 08/19/2023 and was positive to honeybee, yellow jacket, and paper wasp.  He reports that he is not interested in further testing for venom allergy and is not interested in venom immunotherapy at this time.  Of note, tryptase was 4.9, within normal limits.  He continues to avoid carrots with no accidental ingestion or EpiPen use since his last visit to this clinic.  His last food allergy skin testing was on 08/19/2023 and was negative to the selected foods tested.  Carrot was not tested at that time.  He continues to smoke cigarettes daily.  His current medications are listed in the chart.  Assessment and Plan: Andrew is a 52 y.o. male with: Patient Instructions  Allergic rhinitis Continue allergen avoidance measures directed toward grass pollen, tree pollen, mold, dog, cockroach, and feather as listed below Continue in antihistamine once a day as needed for runny nose or itch Continue Flonase 2 sprays in  each nostril once a day as needed for stuffy nose Consider saline nasal rinses as needed for nasal symptoms. Use this before any medicated nasal sprays for best result Consider allergen immunotherapy if your symptoms are not well-controlled with the treatment plan as listed below  Hives  (urticaria) Take the least amount of medications while remaining hive free Cetirizine (Zyrtec) 10mg  twice a day and famotidine (Pepcid) 20 mg twice a day. If no symptoms for 7-14 days then decrease to. Cetirizine (Zyrtec) 10mg  twice a day and famotidine (Pepcid) 20 mg once a day.  If no symptoms for 7-14 days then decrease to. Cetirizine (Zyrtec) 10mg  twice a day.  If no symptoms for 7-14 days then decrease to. Cetirizine (Zyrtec) 10mg  once a day.  May use Benadryl (diphenhydramine) as needed for breakthrough hives       If symptoms return, then step up dosage  Keep a detailed symptom journal including foods eaten, contact with allergens, medications taken, weather changes.   Stinging insect allergy Continue to avoid stinging insects.  In case of an allergic reaction, take Benadryl 50 mg every 4 hours, and if life-threatening symptoms occur, inject with EpiPen 0.3 mg. Consider venom immunotherapy  Alpha gal allergy Continue to avoid mammalian meats. In case of an allergic reaction, take Benadryl 50 mg every 4 hours, and if life-threatening symptoms occur, inject with EpiPen 0.3 mg. We can retest lab levels for alpha gal panel in about 1 year  Food allergy Continue to avoid carrots. In case of an allergic reaction, take Benadryl 50 mg every 4 hours, and if life-threatening symptoms occur, inject with EpiPen 0.3 mg.  Tobacco use Try to cut down on tobacco use or best to quit  Call the clinic if this treatment plan is not working well for you.  Follow up in 6 months or sooner if needed.    Return in about 6 months (around 03/07/2024), or if symptoms worsen or fail to improve.  No orders of the defined types were placed in this encounter.  Lab Orders  No laboratory test(s) ordered today    Diagnostics: None.  Medication List:  Current Outpatient Medications  Medication Sig Dispense Refill   EPINEPHrine (EPIPEN 2-PAK) 0.3 mg/0.3 mL IJ SOAJ injection Inject 0.3 mg into the  muscle as needed for anaphylaxis. 3 each 0   nebivolol (BYSTOLIC) 5 MG tablet Take 1 tablet (5 mg total) by mouth daily. 90 tablet 3   losartan (COZAAR) 50 MG tablet Take 1 tablet (50 mg total) by mouth daily. 90 tablet 1   metFORMIN (GLUCOPHAGE-XR) 500 MG 24 hr tablet Take 1 tablet (500 mg total) by mouth 2 (two) times daily with a meal. Take once daily x 1 month, then increase to one tablet twice daily. (Patient not taking: Reported on 09/08/2023) 60 tablet 5   No current facility-administered medications for this visit.   Allergies: Allergies  Allergen Reactions   Carrot Oil Hives   Codeine Swelling   Penicillins Swelling   I reviewed his past medical history, social history, family history, and environmental history and no significant changes have been reported from previous visit on 08/21/2023.  Review of Systems Objective: Physical Exam Not obtained as encounter was done via telephone.   Previous notes and tests were reviewed.  I discussed the assessment and treatment plan with the patient. The patient was provided an opportunity to ask questions and all were answered. The patient agreed with the plan and demonstrated an understanding of the instructions.  The patient was advised to call back or seek an in-person evaluation if the symptoms worsen or if the condition fails to improve as anticipated.  I provided 30 minutes of non-face-to-face time during this encounter.  It was my pleasure to participate in Andrew Ferguson's care today. Please feel free to contact me with any questions or concerns.   Sincerely,  Thermon Leyland, FNP

## 2023-09-08 NOTE — Patient Instructions (Addendum)
Allergic rhinitis Continue allergen avoidance measures directed toward grass pollen, tree pollen, mold, dog, cockroach, and feather as listed below Continue in antihistamine once a day as needed for runny nose or itch Continue Flonase 2 sprays in each nostril once a day as needed for stuffy nose Consider saline nasal rinses as needed for nasal symptoms. Use this before any medicated nasal sprays for best result Consider allergen immunotherapy if your symptoms are not well-controlled with the treatment plan as listed below  Hives (urticaria) Take the least amount of medications while remaining hive free Cetirizine (Zyrtec) 10mg  twice a day and famotidine (Pepcid) 20 mg twice a day. If no symptoms for 7-14 days then decrease to. Cetirizine (Zyrtec) 10mg  twice a day and famotidine (Pepcid) 20 mg once a day.  If no symptoms for 7-14 days then decrease to. Cetirizine (Zyrtec) 10mg  twice a day.  If no symptoms for 7-14 days then decrease to. Cetirizine (Zyrtec) 10mg  once a day.  May use Benadryl (diphenhydramine) as needed for breakthrough hives       If symptoms return, then step up dosage  Keep a detailed symptom journal including foods eaten, contact with allergens, medications taken, weather changes.   Stinging insect allergy Continue to avoid stinging insects.  In case of an allergic reaction, take Benadryl 50 mg every 4 hours, and if life-threatening symptoms occur, inject with EpiPen 0.3 mg. Consider venom immunotherapy  Alpha gal allergy Continue to avoid mammalian meats. In case of an allergic reaction, take Benadryl 50 mg every 4 hours, and if life-threatening symptoms occur, inject with EpiPen 0.3 mg. We can retest lab levels for alpha gal panel in about 1 year  Food allergy Continue to avoid carrots. In case of an allergic reaction, take Benadryl 50 mg every 4 hours, and if life-threatening symptoms occur, inject with EpiPen 0.3 mg.  Tobacco use Try to cut down on tobacco use or  best to quit  Call the clinic if this treatment plan is not working well for you.  Follow up in 6 months or sooner if needed.  Reducing Pollen Exposure The American Academy of Allergy, Asthma and Immunology suggests the following steps to reduce your exposure to pollen during allergy seasons. Do not hang sheets or clothing out to dry; pollen may collect on these items. Do not mow lawns or spend time around freshly cut grass; mowing stirs up pollen. Keep windows closed at night.  Keep car windows closed while driving. Minimize morning activities outdoors, a time when pollen counts are usually at their highest. Stay indoors as much as possible when pollen counts or humidity is high and on windy days when pollen tends to remain in the air longer. Use air conditioning when possible.  Many air conditioners have filters that trap the pollen spores. Use a HEPA room air filter to remove pollen form the indoor air you breathe.  Control of Mold Allergen Mold and fungi can grow on a variety of surfaces provided certain temperature and moisture conditions exist.  Outdoor molds grow on plants, decaying vegetation and soil.  The major outdoor mold, Alternaria and Cladosporium, are found in very high numbers during hot and dry conditions.  Generally, a late Summer - Fall peak is seen for common outdoor fungal spores.  Rain will temporarily lower outdoor mold spore count, but counts rise rapidly when the rainy period ends.  The most important indoor molds are Aspergillus and Penicillium.  Dark, humid and poorly ventilated basements are ideal sites for mold growth.  The next most common sites of mold growth are the bathroom and the kitchen.  Outdoor [employer] Use air conditioning and keep windows closed Avoid exposure to decaying vegetation. Avoid leaf raking. Avoid grain handling. Consider wearing a face mask if working in moldy areas.  Indoor Mold Control Maintain humidity below 50%. Clean washable  surfaces with 5% bleach solution. Remove sources e.g. Contaminated carpets.  Control of Dog or Cat Allergen Avoidance is the best way to manage a dog or cat allergy. If you have a dog or cat and are allergic to dog or cats, consider removing the dog or cat from the home. If you have a dog or cat but don't want to find it a new home, or if your family wants a pet even though someone in the household is allergic, here are some strategies that may help keep symptoms at bay:  Keep the pet out of your bedroom and restrict it to only a few rooms. Be advised that keeping the dog or cat in only one room will not limit the allergens to that room. Don't pet, hug or kiss the dog or cat; if you do, wash your hands with soap and water. High-efficiency particulate air (HEPA) cleaners run continuously in a bedroom or living room can reduce allergen levels over time. Regular use of a high-efficiency vacuum cleaner or a central vacuum can reduce allergen levels. Giving your dog or cat a bath at least once a week can reduce airborne allergen.  Control of Cockroach Allergen Cockroach allergen has been identified as an important cause of acute attacks of asthma, especially in urban settings.  There are fifty-five species of cockroach that exist in the [place], however only three, the [place], [place] species produce allergen that can affect patients with Asthma.  Allergens can be obtained from fecal particles, egg casings and secretions from cockroaches.    Remove food sources. Reduce access to water. Seal access and entry points. Spray runways with 0.5-1% Diazinon or Chlorpyrifos Blow boric acid power under stoves and refrigerator. Place bait stations (hydramethylnon) at feeding sites.

## 2023-09-09 ENCOUNTER — Encounter: Payer: Self-pay | Admitting: Family Medicine

## 2023-09-09 ENCOUNTER — Other Ambulatory Visit: Payer: Self-pay | Admitting: Internal Medicine

## 2023-09-09 DIAGNOSIS — J302 Other seasonal allergic rhinitis: Secondary | ICD-10-CM | POA: Insufficient documentation

## 2023-09-09 DIAGNOSIS — Z91038 Other insect allergy status: Secondary | ICD-10-CM | POA: Insufficient documentation

## 2023-09-09 DIAGNOSIS — Z91018 Allergy to other foods: Secondary | ICD-10-CM | POA: Insufficient documentation

## 2023-09-09 DIAGNOSIS — L500 Allergic urticaria: Secondary | ICD-10-CM | POA: Insufficient documentation

## 2023-09-09 DIAGNOSIS — I10 Essential (primary) hypertension: Secondary | ICD-10-CM

## 2023-09-09 MED ORDER — LOSARTAN POTASSIUM 50 MG PO TABS: 50.0000 mg | ORAL_TABLET | Freq: Every day | ORAL | 1 refills | Status: DC

## 2023-09-19 ENCOUNTER — Other Ambulatory Visit: Payer: Self-pay | Admitting: Internal Medicine

## 2023-09-19 MED ORDER — AMLODIPINE BESYLATE 5 MG PO TABS: 5.0000 mg | ORAL_TABLET | Freq: Every day | ORAL | 2 refills | Status: DC

## 2023-09-19 NOTE — Progress Notes (Addendum)
Mr. Reaney has expressed concern for adverse side effects and uncontrolled HTN while taking losartan 50 mg daily.  Recently switched to losartan from lisinopril as recommended by allergy and immunology.  Losartan has been discontinued today and amlodipine 5 mg daily prescribed.

## 2023-10-10 ENCOUNTER — Ambulatory Visit (INDEPENDENT_AMBULATORY_CARE_PROVIDER_SITE_OTHER): Payer: BLUE CROSS/BLUE SHIELD | Admitting: Internal Medicine

## 2023-10-10 ENCOUNTER — Encounter: Payer: Self-pay | Admitting: Internal Medicine

## 2023-10-10 VITALS — BP 127/84 | HR 70 | Ht 70.0 in | Wt 166.2 lb

## 2023-10-10 DIAGNOSIS — I10 Essential (primary) hypertension: Secondary | ICD-10-CM | POA: Diagnosis not present

## 2023-10-10 DIAGNOSIS — R1313 Dysphagia, pharyngeal phase: Secondary | ICD-10-CM | POA: Diagnosis not present

## 2023-10-10 DIAGNOSIS — Z9189 Other specified personal risk factors, not elsewhere classified: Secondary | ICD-10-CM

## 2023-10-10 DIAGNOSIS — R0602 Shortness of breath: Secondary | ICD-10-CM

## 2023-10-10 LAB — AMB REFERRAL TO CARDIOLOGY

## 2023-10-10 NOTE — Patient Instructions (Signed)
It was a pleasure to see you today.  Thank you for giving [place] the opportunity to be involved in your care.  Below is a brief recap of your visit and next steps.  We will plan to see you again in January.  Summary ECG today Stop amlodipine Start pepcid Cardiology referral Follow up in January as scheduled

## 2023-10-10 NOTE — Assessment & Plan Note (Signed)
BP today is 127/84.  He is currently prescribed amlodipine 5 mg daily and nebivolol 5 mg daily.  Previously prescribed lisinopril and losartan, but these were discontinued due to concern for adverse reactions.  Reports that he has not been taking amlodipine due to concern that it is the underlying cause of his current symptoms.  I am not convinced that amlodipine is the culprit, however in the absence of a clear etiology with adequate HTN control on nebivolol, it is reasonable to hold amlodipine for now. -Discontinue amlodipine -Continue nebivolol 5 mg daily.

## 2023-10-10 NOTE — Progress Notes (Signed)
Acute Office Visit  Subjective:     Patient ID: Andrew Ferguson, male    DOB: [birth year], 52 y.o.   MRN: [id number]  Chief Complaint  Patient presents with   Hypertension    F/u also swelling in throat. Pt. States he Feels like throat  is swelling shut but can still breath and swallow just feels thick.    Andrew Ferguson presents today for an acute visit endorsing several symptoms.  He is concerned that his throat is swelling.  This has been occurring intermittently for the last several weeks.  ED presentation at [facility] on 9/29 with concern for allergic reaction.  He endorsed a scratchy throat at that time.  History of anaphylaxis after a food exposure at work. Andrew Ferguson also reports that he has been feeling more short of breath than usual, noting that at work he has had to sit down and take breaks after walking for extended periods.  This is new and has not previously been an issue.  Review of Systems  Constitutional:  Negative for chills, fever, malaise/fatigue and weight loss.  HENT:         Scratchy throat, feels like his throat is swollen  Respiratory:  Positive for shortness of breath (With exertion). Negative for cough.   Cardiovascular:  Negative for chest pain, orthopnea, leg swelling and PND.  Skin:  Positive for itching. Negative for rash.  Neurological:  Positive for dizziness (Occasional).      Objective:    BP 127/84 (BP Location: Left Arm, Patient Position: Sitting, Cuff Size: Large)   Pulse 70   Ht 5\' 10"  (1.778 m)   Wt 166 lb 3.2 oz (75.4 kg)   SpO2 95%   BMI 23.85 kg/m   Physical Exam Vitals reviewed.  Constitutional:      General: He is not in acute distress.    Appearance: Normal appearance. He is not ill-appearing.  HENT:     Head: Normocephalic and atraumatic.     Right Ear: External ear normal.     Left Ear: External ear normal.     Nose: Nose normal. No congestion or rhinorrhea.     Mouth/Throat:     Mouth: Mucous membranes are moist.      Pharynx: Oropharynx is clear.  Eyes:     General: No scleral icterus.    Extraocular Movements: Extraocular movements intact.     Conjunctiva/sclera: Conjunctivae normal.     Pupils: Pupils are equal, round, and reactive to light.  Cardiovascular:     Rate and Rhythm: Normal rate and regular rhythm.     Pulses: Normal pulses.     Heart sounds: Normal heart sounds. No murmur heard. Pulmonary:     Effort: Pulmonary effort is normal.     Breath sounds: Normal breath sounds. No wheezing, rhonchi or rales.  Abdominal:     General: Abdomen is flat. Bowel sounds are normal. There is no distension.     Palpations: Abdomen is soft.     Tenderness: There is no abdominal tenderness.  Musculoskeletal:        General: No swelling or deformity. Normal range of motion.     Cervical back: Normal range of motion.  Skin:    General: Skin is warm and dry.     Capillary Refill: Capillary refill takes less than 2 seconds.  Neurological:     General: No focal deficit present.     Mental Status: He is alert and oriented to person, place, and  time.     Motor: No weakness.  Psychiatric:        Mood and Affect: Mood normal.        Behavior: Behavior normal.        Thought Content: Thought content normal.       Assessment & Plan:   Problem List Items Addressed This Visit       Essential hypertension    BP today is 127/84.  He is currently prescribed amlodipine 5 mg daily and nebivolol 5 mg daily.  Previously prescribed lisinopril and losartan, but these were discontinued due to concern for adverse reactions.  Reports that he has not been taking amlodipine due to concern that it is the underlying cause of his current symptoms.  I am not convinced that amlodipine is the culprit, however in the absence of a clear etiology with adequate HTN control on nebivolol, it is reasonable to hold amlodipine for now. -Discontinue amlodipine -Continue nebivolol 5 mg daily.      Pharyngeal dysphagia    He endorses  a myriad of symptoms that are difficult to attribute to an exact etiology.  This includes a sensation of feeling as though his throat is swollen and scratchy.  He also endorses increasing dyspnea with exertion.  Denies orthopnea/PND.  No lower extremity edema present on exam.  He additionally describes occasional dizziness.  ECG obtained today shows regular rate and rhythm.  Lower concern for underlying arrhythmia as the cause.  His symptoms may reflect ongoing allergen exposure at work, however he also has multiple cardiac risk factors (current tobacco use, HTN, etc.).  With this in mind, a cardiology referral has been placed today for further evaluation.  I have also recommended that he take Pepcid every day in case his symptoms are attributable to allergies vs GERD.  Lastly, I recommended that he follow-up with allergy and immunology if symptoms persist despite taking Pepcid as prescribed and if his cardiac evaluation is reassuring.      Return if symptoms worsen or fail to improve.  Billie Lade, MD

## 2023-10-10 NOTE — Assessment & Plan Note (Signed)
He endorses a myriad of symptoms that are difficult to attribute to an exact etiology.  This includes a sensation of feeling as though his throat is swollen and scratchy.  He also endorses increasing dyspnea with exertion.  Denies orthopnea/PND.  No lower extremity edema present on exam.  He additionally describes occasional dizziness.  ECG obtained today shows regular rate and rhythm.  Lower concern for underlying arrhythmia as the cause.  His symptoms may reflect ongoing allergen exposure at work, however he also has multiple cardiac risk factors (current tobacco use, HTN, etc.).  With this in mind, a cardiology referral has been placed today for further evaluation.  I have also recommended that he take Pepcid every day in case his symptoms are attributable to allergies vs GERD.  Lastly, I recommended that he follow-up with allergy and immunology if symptoms persist despite taking Pepcid as prescribed and if his cardiac evaluation is reassuring.

## 2023-10-11 ENCOUNTER — Ambulatory Visit: Payer: BLUE CROSS/BLUE SHIELD | Admitting: Internal Medicine

## 2023-10-12 ENCOUNTER — Other Ambulatory Visit: Payer: Self-pay | Admitting: Internal Medicine

## 2023-10-12 DIAGNOSIS — F410 Panic disorder [episodic paroxysmal anxiety] without agoraphobia: Secondary | ICD-10-CM | POA: Insufficient documentation

## 2023-10-12 MED ORDER — ALPRAZOLAM 0.25 MG PO TABS: 0.2500 mg | ORAL_TABLET | Freq: Two times a day (BID) | ORAL | 0 refills | Status: DC | PRN

## 2023-10-13 ENCOUNTER — Encounter: Payer: Self-pay | Admitting: Internal Medicine

## 2023-10-14 ENCOUNTER — Other Ambulatory Visit: Payer: Self-pay | Admitting: Internal Medicine

## 2023-10-14 DIAGNOSIS — I10 Essential (primary) hypertension: Secondary | ICD-10-CM

## 2023-10-14 MED ORDER — LOSARTAN POTASSIUM 50 MG PO TABS: 50.0000 mg | ORAL_TABLET | Freq: Every day | ORAL | 1 refills | Status: DC

## 2023-10-18 ENCOUNTER — Telehealth: Payer: Self-pay | Admitting: Allergy & Immunology

## 2023-10-18 ENCOUNTER — Encounter: Payer: Self-pay | Admitting: Family Medicine

## 2023-10-18 NOTE — Telephone Encounter (Signed)
Patient's wife called stating he wants to do more skin testing for his next appointment on November 1st. Patient's wife states he got a bill in the mail and states that we were suppose to do a prior authorization for skin testing in order for his insurance to approve it. Patient wife states she needs to speak with someone in our billings department to file an appeal. I advised the wife that she did not need a prior authorization for skin testing. Patient's wife insisted we were suppose to do a prior authorization for the skin testing.

## 2023-10-19 ENCOUNTER — Ambulatory Visit: Payer: BLUE CROSS/BLUE SHIELD | Attending: Internal Medicine | Admitting: Internal Medicine

## 2023-10-19 ENCOUNTER — Encounter: Payer: Self-pay | Admitting: Internal Medicine

## 2023-10-19 VITALS — BP 98/68 | HR 72 | Ht 70.0 in | Wt 162.0 lb

## 2023-10-19 DIAGNOSIS — R0602 Shortness of breath: Secondary | ICD-10-CM | POA: Diagnosis not present

## 2023-10-19 DIAGNOSIS — F1010 Alcohol abuse, uncomplicated: Secondary | ICD-10-CM

## 2023-10-19 DIAGNOSIS — R0609 Other forms of dyspnea: Secondary | ICD-10-CM

## 2023-10-19 DIAGNOSIS — I10 Essential (primary) hypertension: Secondary | ICD-10-CM | POA: Diagnosis not present

## 2023-10-19 NOTE — Patient Instructions (Addendum)
Medication Instructions:  Your physician recommends that you continue on your current medications as directed. Please refer to the Current Medication list given to you today.   Labwork: None  Testing/Procedures: Your physician has requested that you have an echocardiogram. Echocardiography is a painless test that uses sound waves to create images of your heart. It provides your doctor with information about the size and shape of your heart and how well your heart's chambers and valves are working. This procedure takes approximately one hour. There are no restrictions for this procedure. Please do NOT wear cologne, perfume, aftershave, or lotions (deodorant is allowed). Please arrive 15 minutes prior to your appointment time.  Your physician has requested that you have en exercise stress myoview. For further information please visit [email or web address] Please follow instruction sheet, as given.   Follow-Up: Your physician recommends that you schedule a follow-up appointment in: Pending Results  Any Other Special Instructions Will Be Listed Below (If Applicable). Thank you for choosing [facility]!      If you need a refill on your cardiac medications before your next appointment, please call your pharmacy.

## 2023-10-20 DIAGNOSIS — R0609 Other forms of dyspnea: Secondary | ICD-10-CM | POA: Insufficient documentation

## 2023-10-20 DIAGNOSIS — F1010 Alcohol abuse, uncomplicated: Secondary | ICD-10-CM | POA: Insufficient documentation

## 2023-10-20 NOTE — Progress Notes (Signed)
Cardiology Office Note  Date: 10/20/2023   ID: Andrew Ferguson, DOB April 14, 1971, MRN [id number]  PCP:  Billie Lade, MD  Cardiologist:  Marjo Bicker, MD Electrophysiologist:  None   History of Present Illness: Andrew Ferguson is a 52 y.o. male known to have HTN, alcohol use was referred to cardiology clinic for evaluation of DOE.  Ongoing DOE for the last few months, denies any orthopnea or PND, denies any leg swelling.  No angina, syncope, palpitations.  He does have dizziness intermittently.  PFTs from 2021 showed moderate COPD, asthmatic type.  Currently does not use any inhaler.  Past Medical History:  Diagnosis Date   Hypertension     Past Surgical History:  Procedure Laterality Date   TONSILLECTOMY     TYMPANOSTOMY TUBE PLACEMENT Bilateral     Current Outpatient Medications  Medication Sig Dispense Refill   EPINEPHrine (EPIPEN 2-PAK) 0.3 mg/0.3 mL IJ SOAJ injection Inject 0.3 mg into the muscle as needed for anaphylaxis. 3 each 0   losartan (COZAAR) 50 MG tablet Take 1 tablet (50 mg total) by mouth daily. 90 tablet 1   nebivolol (BYSTOLIC) 5 MG tablet Take 1 tablet (5 mg total) by mouth daily. 90 tablet 3   ALPRAZolam (XANAX) 0.25 MG tablet Take 1 tablet (0.25 mg total) by mouth 2 (two) times daily as needed for anxiety. 10 tablet 0   Multiple Vitamin (MULTIVITAMIN) tablet Take 1 tablet by mouth daily. (Patient not taking: Reported on 10/19/2023)     No current facility-administered medications for this visit.   Allergies:  Carrot oil, Codeine, and Penicillins   Social History: The patient  reports that he has been smoking cigarettes. His smokeless tobacco use includes chew. He reports current alcohol use. He reports that he does not use drugs.   Family History: The patient's family history includes Healthy in his father and mother.   ROS:  Please see the history of present illness. Otherwise, complete review of systems is positive for none  All other  systems are reviewed and negative.   Physical Exam: VS:  BP 98/68 (BP Location: Left Arm, Patient Position: Sitting, Cuff Size: Normal)   Pulse 72   Ht 5\' 10"  (1.778 m)   Wt 162 lb (73.5 kg)   SpO2 94%   BMI 23.24 kg/m , BMI Body mass index is 23.24 kg/m.  Wt Readings from Last 3 Encounters:  10/19/23 162 lb (73.5 kg)  10/10/23 166 lb 3.2 oz (75.4 kg)  08/19/23 161 lb 4 oz (73.1 kg)    General: Patient appears comfortable at rest. HEENT: Conjunctiva and lids normal, oropharynx clear with moist mucosa. Neck: Supple, no elevated JVP or carotid bruits, no thyromegaly. Lungs: Clear to auscultation, nonlabored breathing at rest. Cardiac: Regular rate and rhythm, no S3 or significant systolic murmur, no pericardial rub. Abdomen: Soft, nontender, no hepatomegaly, bowel sounds present, no guarding or rebound. Extremities: No pitting edema, distal pulses 2+. Skin: Warm and dry. Musculoskeletal: No kyphosis. Neuropsychiatric: Alert and oriented x3, affect grossly appropriate.  Recent Labwork: 05/19/2023: ALT 40; AST 30; BUN 6; Creatinine, Ser 0.74; Hemoglobin 15.7; Platelets 222; Potassium 4.5; Sodium 141; TSH 0.610     Component Value Date/Time   CHOL 173 05/19/2023 0859   TRIG 69 05/19/2023 0859   HDL 96 05/19/2023 0859   CHOLHDL 1.8 05/19/2023 0859   LDLCALC 64 05/19/2023 0859     Assessment and Plan:  DOE: Ongoing DOE for the last few months, denies orthopnea  or PND. No leg swelling.  Will obtain 2D echocardiogram and NM stress test.  However PFTs in 2021 showed moderate COPD, asthmatic type.  Alcohol abuse: Drinks 5 bottles of alcohol daily, counseling provided.  I discussed the risks of alcohol use including accelerated CAD, arrhythmias, cardiomyopathy etc.  Patient verbalized understanding.  HTN, controlled: Continue losartan 50 mg once daily and nebivolol 5 mg once daily.   I have spent a total duration of 45 minutes during the prior notes, labs, EKG, face-to-face  discussion/counseling of his medical condition, pathophysiology, evaluation, management and documenting findings in the note.  I reviewed the medications as below.       Disposition:  Follow up  pending results  Signed Eyvonne Burchfield Verne Spurr, MD, 10/20/2023 8:40 AM    [facility] [address] [place], [place], [place] [address]

## 2023-10-21 ENCOUNTER — Ambulatory Visit: Payer: BLUE CROSS/BLUE SHIELD | Admitting: Orthopedic Surgery

## 2023-10-26 ENCOUNTER — Other Ambulatory Visit: Payer: Self-pay | Admitting: Internal Medicine

## 2023-10-26 DIAGNOSIS — Z87892 Personal history of anaphylaxis: Secondary | ICD-10-CM

## 2023-10-27 NOTE — Telephone Encounter (Signed)
Forwarding both myChart message to the billing department for assistance.

## 2023-10-27 NOTE — Telephone Encounter (Signed)
Patient's wife called back stating appointment could not be rescheduled and they were expecting to be doing the skin testing for foods at tomorrow's visit. Wife also inquired about genetic testing bill and prior authorization.   Informed wife the visit for tomorrow is scheduled as an office visit and not the testing. Wife states patient has been having allergic reactions and wants food testing done. Patient's last reaction was 09/17/2023.   Spoke to Nada - our office does not typically do the prior authorizations for genetic testing& patient's claim from 08/19/2023 was paid by insurance. Also spoke to Epes - patient can do testing tomorrow as long as he has been off antihistamines for 72 hours prior to visit. Thurston Hole states she will only do the food skin testing.   Spoke to Wife, informed her we can do testing at tomorrow's visit as long as patient has stayed off antihistamines. Wife states patient has not and rescheduled appointment to 11-11-2023 in [place]. Advised wife patient needs to stay off antihistamines 72 hours prior to appointment. Wife verbalized understanding.   In regards to genetic testing bill, wife states she has started the appeal process with insurance. Bill she received was not from our office but [facility]. Wife spoke to insurance and was told the rendering provider should have done the prior authorization which is why she reached out to our office. Wife states she will take care of appeal.

## 2023-10-27 NOTE — Progress Notes (Deleted)
   [address] [place] [address] Dept: [phone]  FOLLOW UP NOTE  Patient ID: Andrew Ferguson, male    DOB: 25-Feb-1971  Age: 52 y.o. MRN: [id number] Date of Office Visit: 10/28/2023  Assessment  Chief Complaint: No chief complaint on file.  HPI Andrew B Leno   Discussed the use of AI scribe software for clinical note transcription with the patient, who gave verbal consent to proceed.  History of Present Illness             Drug Allergies:  Allergies  Allergen Reactions   Carrot Oil Hives   Codeine Swelling   Penicillins Swelling    Physical Exam: There were no vitals taken for this visit.   Physical Exam  Diagnostics:    Assessment and Plan: No diagnosis found.  No orders of the defined types were placed in this encounter.   There are no Patient Instructions on file for this visit.  No follow-ups on file.    Thank you for the opportunity to care for this patient.  Please do not hesitate to contact me with questions.  Thermon Leyland, FNP [facility]

## 2023-10-27 NOTE — Telephone Encounter (Signed)
08/19/23 has been paid by insurance. The testing that was done was percutaneous testing for allergies and foods, not denied on our end and no prior authorization was requested or needed.

## 2023-10-28 ENCOUNTER — Ambulatory Visit: Payer: BLUE CROSS/BLUE SHIELD | Admitting: Family Medicine

## 2023-11-02 ENCOUNTER — Ambulatory Visit: Payer: BLUE CROSS/BLUE SHIELD | Attending: Internal Medicine

## 2023-11-02 DIAGNOSIS — R0602 Shortness of breath: Secondary | ICD-10-CM | POA: Diagnosis not present

## 2023-11-02 LAB — ECHOCARDIOGRAM COMPLETE
AR max vel: 3.16 cm2
AV Area VTI: 2.92 cm2
AV Area mean vel: 2.88 cm2
AV Mean grad: 3 mm[Hg]
AV Peak grad: 5.3 mm[Hg]
Ao pk vel: 1.15 m/s
Area-P 1/2: 2.94 cm2
Calc EF: 60 %
MV VTI: 2.93 cm2
S' Lateral: 2.8 cm
Single Plane A2C EF: 58.9 %
Single Plane A4C EF: 59.7 %

## 2023-11-03 ENCOUNTER — Encounter (HOSPITAL_COMMUNITY): Payer: Self-pay

## 2023-11-03 ENCOUNTER — Encounter (HOSPITAL_COMMUNITY)
Admission: RE | Admit: 2023-11-03 | Discharge: 2023-11-03 | Disposition: A | Discharge status: Home / Self Care | Payer: BLUE CROSS/BLUE SHIELD | Source: Ambulatory Visit | Attending: Internal Medicine | Admitting: Internal Medicine

## 2023-11-03 ENCOUNTER — Ambulatory Visit (HOSPITAL_COMMUNITY)
Admission: RE | Admit: 2023-11-03 | Discharge: 2023-11-03 | Disposition: A | Discharge status: Home / Self Care | Payer: BLUE CROSS/BLUE SHIELD | Source: Ambulatory Visit | Attending: Internal Medicine | Admitting: Internal Medicine

## 2023-11-03 DIAGNOSIS — R0602 Shortness of breath: Secondary | ICD-10-CM | POA: Diagnosis present

## 2023-11-03 LAB — NM MYOCAR MULTI W/SPECT W/WALL MOTION / EF
Estimated workload: 7
Exercise duration (min): 5 min
Exercise duration (sec): 24 s
LV dias vol: 94 mL (ref 62–150)
LV sys vol: 37 mL
MPHR: 166 {beats}/min
Nuc Stress EF: 61 %
Peak HR: 151 {beats}/min
Percent HR: 90 %
RATE: 0.3
RPE: 11
Rest HR: 55 {beats}/min
Rest Nuclear Isotope Dose: 9.2 mCi
SDS: 0
SRS: 5
SSS: 5
ST Depression (mm): 0 mm
Stress Nuclear Isotope Dose: 24.5 mCi
TID: 1.09

## 2023-11-03 MED ORDER — TECHNETIUM TC 99M TETROFOSMIN IV KIT
30.0000 | PACK | Freq: Once | INTRAVENOUS | Status: AC | PRN
  Administered 2023-11-03: 24.5 via INTRAVENOUS

## 2023-11-03 MED ORDER — TECHNETIUM TC 99M TETROFOSMIN IV KIT
10.0000 | PACK | Freq: Once | INTRAVENOUS | Status: AC | PRN
  Administered 2023-11-03: 9.2 via INTRAVENOUS

## 2023-11-03 MED ORDER — SODIUM CHLORIDE FLUSH 0.9 % IV SOLN
INTRAVENOUS | Status: AC
  Administered 2023-11-03: 10 mL via INTRAVENOUS
  Filled 2023-11-03: qty 10

## 2023-11-03 MED ORDER — REGADENOSON 0.4 MG/5ML IV SOLN
INTRAVENOUS | Status: AC
  Filled 2023-11-03: qty 5

## 2023-11-04 ENCOUNTER — Other Ambulatory Visit: Payer: Self-pay | Admitting: Internal Medicine

## 2023-11-04 DIAGNOSIS — Z9189 Other specified personal risk factors, not elsewhere classified: Secondary | ICD-10-CM

## 2023-11-07 ENCOUNTER — Ambulatory Visit (INDEPENDENT_AMBULATORY_CARE_PROVIDER_SITE_OTHER): Payer: BLUE CROSS/BLUE SHIELD | Admitting: Orthopedic Surgery

## 2023-11-07 DIAGNOSIS — M19032 Primary osteoarthritis, left wrist: Secondary | ICD-10-CM

## 2023-11-07 MED ORDER — LIDOCAINE HCL 1 % IJ SOLN
1.0000 mL | INTRAMUSCULAR | Status: AC | PRN
  Administered 2023-11-07: 1 mL

## 2023-11-07 MED ORDER — BETAMETHASONE SOD PHOS & ACET 6 (3-3) MG/ML IJ SUSP
6.0000 mg | INTRAMUSCULAR | Status: AC | PRN
  Administered 2023-11-07: 6 mg via INTRA_ARTICULAR

## 2023-11-07 NOTE — Progress Notes (Signed)
Andrew B Hotz - 52 y.o. male MRN [id number]  Date of birth: November 26, 1971  Office Visit Note: Visit Date: 11/07/2023 PCP: Billie Lade, MD Referred by: Billie Lade, MD  Subjective: No chief complaint on file.  HPI: Andrew Ferguson is a pleasant 52 y.o. male who presents today for follow-up of ongoing left wrist osteoarthritis currently being managed conservatively.  He has been utilizing a wrist brace since his prior visit approximately 6 weeks ago.  X-rays at that time did show significant radiocarpal arthritis with significant degenerative change particularly at the radial scaphoid interface.  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  Assessment & Plan: Visit Diagnoses: No diagnosis found.  Plan: Extensive discussion was once again had the patient today regarding his left wrist.  We reviewed the results of his prior x-ray again today.  As known previously, he does have significant arthritic change at the left radiocarpal interval, particular at the radius scaphoid interface.  This could be posttraumatic in nature given the appearance of the scaphoid on the x-ray.    For the time being, he would still like to stick with conservative modalities.  Given that he has not undergone prior cortisone injection to the radiocarpal interface in the past for symptom relief, this may help augment his conservative care.    From a surgical standpoint, he would be a reasonable candidate for proximal row carpectomy in the future should he have ongoing significant pain in this region that is refractory to conservative care.  He expressed understanding of this.  Injection to the radiocarpal interval was administered today without issue, he will return as needed should his symptoms remain refractory to conservative care for potential surgical discussion.   Follow-up: No follow-ups on file.   Meds & Orders: No orders of the  defined types were placed in this encounter.  No orders of the defined types were placed in this encounter.    Procedures: Medium Joint Inj: L radiocarpal on 11/07/2023 8:34 PM Details: 25 G needle, dorsal approach Medications: 1 mL lidocaine 1 %; 6 mg betamethasone acetate-betamethasone sodium phosphate 6 (3-3) MG/ML Outcome: tolerated well, no immediate complications         Clinical History: No specialty comments available.  He reports that he has been smoking cigarettes. His smokeless tobacco use includes chew.  Recent Labs    05/19/23 0859  HGBA1C 6.4*    Objective:   Vital Signs: There were no vitals taken for this visit.  Physical Exam  Gen: Well-appearing, in no acute distress; non-toxic CV: Regular Rate. Well-perfused. Warm.  Resp: Breathing unlabored on room air; no wheezing. Psych: Fluid speech in conversation; appropriate affect; normal thought process  Ortho Exam Left wrist: - Notable swelling over the dorsal radial aspect of the left wrist, no significant erythema or warmth - Wrist flexion/extension left 45/40, right 65/55 - Full pronation and supination of the left wrist without significant restriction - Negative Finkelstein's left side, no significant tenderness over the intersection region - Sensation intact in all distributions, hand is warm well-perfused  Imaging: No results found.  Past Medical/Family/Surgical/Social History: Medications & Allergies reviewed per EMR, new medications updated. Patient Active Problem List   Diagnosis Date Noted   DOE (dyspnea on exertion) 10/20/2023   Alcohol abuse 10/20/2023   Panic disorder 10/12/2023   Seasonal and perennial allergic rhinitis 09/09/2023   Allergy to alpha-gal 09/09/2023   Hymenoptera allergy 09/09/2023  Allergic urticaria 09/09/2023   Anaphylactic shock due to adverse food reaction 07/19/2023   Essential hypertension 05/25/2023   Tobacco use 05/25/2023   Palate mass 02/28/2023    Pharyngeal dysphagia 02/28/2023   Oral lesion 02/21/2023   Past Medical History:  Diagnosis Date   Hypertension    Family History  Problem Relation Age of Onset   Healthy Mother    Healthy Father    Allergic rhinitis Neg Hx    Angioedema Neg Hx    Asthma Neg Hx    Eczema Neg Hx    Past Surgical History:  Procedure Laterality Date   TONSILLECTOMY     TYMPANOSTOMY TUBE PLACEMENT Bilateral    Social History   Occupational History   Not on file  Tobacco Use   Smoking status: Some Days    Current packs/day: 0.25    Types: Cigarettes   Smokeless tobacco: Current    Types: Chew   Tobacco comments:    Smokes cigarettes every now and then. Does Dip Tobacco-08/19/23  Vaping Use   Vaping status: Never Used  Substance and Sexual Activity   Alcohol use: Yes    Comment: 6-7 beers every other day    Drug use: No   Sexual activity: Not on file    Kahlie Deutscher Fara Boros) Denese Killings, M.D. [facility]

## 2023-11-08 ENCOUNTER — Telehealth: Payer: Self-pay

## 2023-11-08 NOTE — Telephone Encounter (Addendum)
Will ask our [facility] Phlebotomist about prior authorization.  Spoke to [employer], [employer] - There is not a notification advising them to let the patient know to reach out to their insurance company regarding the lab test.  Most patient insurances do not require a prior authorization - patient would have needed to contact their insurance company regarding the test before approving/authorizing to have the blood work done.   Forwarding updated message to provider and patient.

## 2023-11-08 NOTE — Telephone Encounter (Signed)
Please disregard error

## 2023-11-10 NOTE — Patient Instructions (Addendum)
Allergic rhinitis Continue allergen avoidance measures directed toward grass pollen, tree pollen, mold, dog, cockroach, and feather as listed below Continue in antihistamine once a day as needed for runny nose or itch Continue Flonase 2 sprays in each nostril once a day as needed for stuffy nose Consider saline nasal rinses as needed for nasal symptoms. Use this before any medicated nasal sprays for best result Consider allergen immunotherapy if your symptoms are not well-controlled with the treatment plan as listed below  Hives (urticaria) Take the least amount of medications while remaining hive free Cetirizine (Zyrtec) 10mg  twice a day and famotidine (Pepcid) 20 mg twice a day. If no symptoms for 7-14 days then decrease to. Cetirizine (Zyrtec) 10mg  twice a day and famotidine (Pepcid) 20 mg once a day.  If no symptoms for 7-14 days then decrease to. Cetirizine (Zyrtec) 10mg  twice a day.  If no symptoms for 7-14 days then decrease to. Cetirizine (Zyrtec) 10mg  once a day.  May use Benadryl (diphenhydramine) as needed for breakthrough hives       If symptoms return, then step up dosage Keep a detailed symptom journal including foods eaten, contact with allergens, medications taken, weather changes.   Stinging insect allergy Continue to avoid stinging insects.  In case of an allergic reaction, take Benadryl 50 mg every 4 hours, and if life-threatening symptoms occur, inject with EpiPen 0.3 mg. Consider venom immunotherapy  Alpha gal allergy Continue to avoid mammalian meats. In case of an allergic reaction, take Benadryl 50 mg every 4 hours, and if life-threatening symptoms occur, inject with EpiPen 0.3 mg. We can retest lab levels for alpha gal panel in about 1 year Consider Xolair for food allergy. Written information provided at the last visit.   Food allergy Food allergy to the most allergenic foods was negative at your last visit. Selected foods were negative at today's visit. We have  ordered yeast and chili pepper labs. We will call you when the results become available Continue to avoid carrots. In case of an allergic reaction, take Benadryl 50 mg every 4 hours, and if life-threatening symptoms occur, inject with EpiPen 0.3 mg.  Tobacco use Try to cut down on tobacco use or best to quit  Call the clinic if this treatment plan is not working well for you.  Follow up in 6 months or sooner if needed.  Reducing Pollen Exposure The American Academy of Allergy, Asthma and Immunology suggests the following steps to reduce your exposure to pollen during allergy seasons. Do not hang sheets or clothing out to dry; pollen may collect on these items. Do not mow lawns or spend time around freshly cut grass; mowing stirs up pollen. Keep windows closed at night.  Keep car windows closed while driving. Minimize morning activities outdoors, a time when pollen counts are usually at their highest. Stay indoors as much as possible when pollen counts or humidity is high and on windy days when pollen tends to remain in the air longer. Use air conditioning when possible.  Many air conditioners have filters that trap the pollen spores. Use a HEPA room air filter to remove pollen form the indoor air you breathe.  Control of Mold Allergen Mold and fungi can grow on a variety of surfaces provided certain temperature and moisture conditions exist.  Outdoor molds grow on plants, decaying vegetation and soil.  The major outdoor mold, Alternaria and Cladosporium, are found in very high numbers during hot and dry conditions.  Generally, a late Summer - Fall  peak is seen for common outdoor fungal spores.  Rain will temporarily lower outdoor mold spore count, but counts rise rapidly when the rainy period ends.  The most important indoor molds are Aspergillus and Penicillium.  Dark, humid and poorly ventilated basements are ideal sites for mold growth.  The next most common sites of mold growth are the  bathroom and the kitchen.  Outdoor [employer] Use air conditioning and keep windows closed Avoid exposure to decaying vegetation. Avoid leaf raking. Avoid grain handling. Consider wearing a face mask if working in moldy areas.  Indoor Mold Control Maintain humidity below 50%. Clean washable surfaces with 5% bleach solution. Remove sources e.g. Contaminated carpets.  Control of Dog or Cat Allergen Avoidance is the best way to manage a dog or cat allergy. If you have a dog or cat and are allergic to dog or cats, consider removing the dog or cat from the home. If you have a dog or cat but don't want to find it a new home, or if your family wants a pet even though someone in the household is allergic, here are some strategies that may help keep symptoms at bay:  Keep the pet out of your bedroom and restrict it to only a few rooms. Be advised that keeping the dog or cat in only one room will not limit the allergens to that room. Don't pet, hug or kiss the dog or cat; if you do, wash your hands with soap and water. High-efficiency particulate air (HEPA) cleaners run continuously in a bedroom or living room can reduce allergen levels over time. Regular use of a high-efficiency vacuum cleaner or a central vacuum can reduce allergen levels. Giving your dog or cat a bath at least once a week can reduce airborne allergen.  Control of Cockroach Allergen Cockroach allergen has been identified as an important cause of acute attacks of asthma, especially in urban settings.  There are fifty-five species of cockroach that exist in the [place], however only three, the [place], [place] species produce allergen that can affect patients with Asthma.  Allergens can be obtained from fecal particles, egg casings and secretions from cockroaches.    Remove food sources. Reduce access to water. Seal access and entry points. Spray runways with 0.5-1% Diazinon or Chlorpyrifos Blow boric acid  power under stoves and refrigerator. Place bait stations (hydramethylnon) at feeding sites.

## 2023-11-10 NOTE — Progress Notes (Signed)
[address] [place] [address] Dept: [phone]  FOLLOW UP NOTE  Patient ID: Andrew Ferguson, male    DOB: [birth year]  Age: 52 y.o. MRN: [id number] Date of Office Visit: 11/11/2023  Assessment  Chief Complaint: Allergy Testing  HPI Andrew B Locy is a 52 year old male who presents to the clinic for follow-up visit.  He was last seen by this clinic via televisit on 09/08/2023 by Thermon Leyland, FNP, for evaluation of allergic rhinitis, urticaria with a positive chronic urticaria panel, stinging insect allergy, alpha gal allergy, tobacco use, and food allergy to carrot.  He is accompanied by his wife who assists with history.  At today's visit, he reports that he continues to experience daily throat closing sensation.  He reports that he has been to several physicians who have noted no physical changes during this sensation.  He does continue antihistamine daily and famotidine twice a day.  He continues Benadryl as needed with moderate relief of throat closing.  He denies cardiopulmonary or gastrointestinal symptoms with this throat closing sensation.  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  He is planning to leave this job and return to his previous employer within the next month or so.  Alpha gal allergy is reported as moderately well-controlled with throat closing sensation noted daily.  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  He does not ingest the beef flurry directly, however.  He does take several over-the-counter vitamins and has been instructed to look for ingredients such as gelatin before continuing. Alpha gal panel was positive to alpha gal and components on 08/19/2023 via lab.  Allergic rhinitis is reported as moderately well-controlled with nasal congestion as the main symptom which is the worst while at work and rare postnasal drainage.  He continues cetirizine 10 mg once a day and is not currently using  Flonase or nasal saline rinses.  His last environmental allergy skin panel was on 08/19/2023 and was positive to grass pollen, tree pollen, mold, dog, feather, and cockroach.  He continues to avoid stinging insects with no insect stings since his last visit to this clinic. EpiPen's are up-to-date.  He reports no urticarial outbreaks since his last visit to this clinic.  He continues cetirizine and famotidine daily. Urticaria panel was positive via lab on 08/19/2023.  He arrives at the clinic with a list of foods that he is interested in testing.the list includes coffee, eggs, milk, cheese, butter, caffeine, nuts, peanut butter, chicken, [place], barley, wheat, rye, mold, hops, yeast, soy, zucchini, squash, which is sure Schaus, mayonnaise, nicotine, vitamin D, vitamin B12, vitamin C, vitamin D Centrum men's 50+, crush red pepper, garlic, oranges, and pineapple. His last food allergy skin testing was negative to the most common allergenic foods (1 through 17 on the food panel).  He is currently avoiding mammalian meat and carrots.  He is consuming cows milk products without issue.  EpiPen's are up-to-date  His current medications are listed in the chart.  Drug Allergies:  Allergies  Allergen Reactions   Alpha-Gal    Carrot Oil Hives   Codeine Swelling   Penicillins Swelling    Physical Exam: BP 116/80   Pulse (!) 101   Temp 98 F (36.7 C)   Resp 16   Wt 164 lb 4 oz (74.5 kg)   SpO2 97%   BMI 23.57 kg/m    Physical Exam Vitals reviewed.  Constitutional:  Appearance: Normal appearance.  HENT:     Head: Normocephalic and atraumatic.     Right Ear: Tympanic membrane normal.     Left Ear: Tympanic membrane normal.     Nose:     Comments: Bilateral nares normal. Pharynx slightly erythematous with no exudate. Ears normal. Eyes normal.    Mouth/Throat:     Pharynx: Oropharynx is clear.  Eyes:     Conjunctiva/sclera: Conjunctivae normal.  Cardiovascular:     Rate and Rhythm:  Normal rate and regular rhythm.     Heart sounds: Normal heart sounds. No murmur heard. Pulmonary:     Effort: Pulmonary effort is normal.     Breath sounds: Normal breath sounds.     Comments: Lungs clear to auscultation Musculoskeletal:     Cervical back: Normal range of motion and neck supple.  Neurological:     Mental Status: He is alert.     Diagnostics: Selected food allergy skin testing was negative with adequate controls.  Assessment and Plan: 1. Anaphylactic shock due to food, subsequent encounter   2. Allergy to alpha-gal   3. Tobacco use   4. Idiopathic urticaria   5. Hymenoptera allergy   6. Seasonal and perennial allergic rhinitis     Patient Instructions  Allergic rhinitis Continue allergen avoidance measures directed toward grass pollen, tree pollen, mold, dog, cockroach, and feather as listed below Continue in antihistamine once a day as needed for runny nose or itch Continue Flonase 2 sprays in each nostril once a day as needed for stuffy nose Consider saline nasal rinses as needed for nasal symptoms. Use this before any medicated nasal sprays for best result Consider allergen immunotherapy if your symptoms are not well-controlled with the treatment plan as listed below  Hives (urticaria) Take the least amount of medications while remaining hive free Cetirizine (Zyrtec) 10mg  twice a day and famotidine (Pepcid) 20 mg twice a day. If no symptoms for 7-14 days then decrease to. Cetirizine (Zyrtec) 10mg  twice a day and famotidine (Pepcid) 20 mg once a day.  If no symptoms for 7-14 days then decrease to. Cetirizine (Zyrtec) 10mg  twice a day.  If no symptoms for 7-14 days then decrease to. Cetirizine (Zyrtec) 10mg  once a day.  May use Benadryl (diphenhydramine) as needed for breakthrough hives       If symptoms return, then step up dosage Keep a detailed symptom journal including foods eaten, contact with allergens, medications taken, weather changes.   Stinging  insect allergy Continue to avoid stinging insects.  In case of an allergic reaction, take Benadryl 50 mg every 4 hours, and if life-threatening symptoms occur, inject with EpiPen 0.3 mg. Consider venom immunotherapy  Alpha gal allergy Continue to avoid mammalian meats. In case of an allergic reaction, take Benadryl 50 mg every 4 hours, and if life-threatening symptoms occur, inject with EpiPen 0.3 mg. We can retest lab levels for alpha gal panel in about 1 year Consider Xolair for food allergy. Written information provided at the last visit.   Food allergy Food allergy to the most allergenic foods was negative at your last visit. Selected foods were negative at today's visit. We have ordered yeast and chili pepper labs. We will call you when the results become available Continue to avoid carrots. In case of an allergic reaction, take Benadryl 50 mg every 4 hours, and if life-threatening symptoms occur, inject with EpiPen 0.3 mg.  Tobacco use Try to cut down on tobacco use or best to quit  Call  the clinic if this treatment plan is not working well for you.  Follow up in 6 months or sooner if needed.   Return in about 6 months (around 05/10/2024), or if symptoms worsen or fail to improve.    Thank you for the opportunity to care for this patient.  Please do not hesitate to contact me with questions.  Thermon Leyland, FNP [facility]

## 2023-11-11 ENCOUNTER — Ambulatory Visit (INDEPENDENT_AMBULATORY_CARE_PROVIDER_SITE_OTHER): Payer: BLUE CROSS/BLUE SHIELD | Admitting: Family Medicine

## 2023-11-11 ENCOUNTER — Encounter: Payer: Self-pay | Admitting: Family Medicine

## 2023-11-11 VITALS — BP 116/80 | HR 101 | Temp 98.0°F | Resp 16 | Wt 164.2 lb

## 2023-11-11 DIAGNOSIS — L501 Idiopathic urticaria: Secondary | ICD-10-CM

## 2023-11-11 DIAGNOSIS — Z91038 Other insect allergy status: Secondary | ICD-10-CM

## 2023-11-11 DIAGNOSIS — T7800XD Anaphylactic reaction due to unspecified food, subsequent encounter: Secondary | ICD-10-CM | POA: Diagnosis not present

## 2023-11-11 DIAGNOSIS — J3089 Other allergic rhinitis: Secondary | ICD-10-CM

## 2023-11-11 DIAGNOSIS — Z720 Tobacco use: Secondary | ICD-10-CM | POA: Diagnosis not present

## 2023-11-11 DIAGNOSIS — Z91018 Allergy to other foods: Secondary | ICD-10-CM | POA: Diagnosis not present

## 2023-11-11 DIAGNOSIS — J302 Other seasonal allergic rhinitis: Secondary | ICD-10-CM

## 2023-11-14 NOTE — Addendum Note (Signed)
Addended by: Elsworth Soho on: 11/14/2023 03:30 PM   Modules accepted: Orders

## 2023-11-15 LAB — ALLERGEN,CHILI PEPPER,RF279

## 2023-11-15 LAB — ALLERGEN, BAKERS YEAST, F45: F045-IgE Yeast: <0.1 kU/L

## 2023-11-15 NOTE — Progress Notes (Signed)
Can you please let this patient know that his yeast food allergy testing was negative. He can continue to consume products containing yeast. We also did get a notification that the chili pepper testing was not performed due to recent unavailability of the test. Thank you

## 2023-11-23 ENCOUNTER — Ambulatory Visit: Payer: BLUE CROSS/BLUE SHIELD | Admitting: Family Medicine

## 2023-12-17 ENCOUNTER — Other Ambulatory Visit: Payer: Self-pay | Admitting: Internal Medicine

## 2024-01-03 ENCOUNTER — Encounter: Payer: Self-pay | Admitting: Internal Medicine

## 2024-01-03 ENCOUNTER — Ambulatory Visit (INDEPENDENT_AMBULATORY_CARE_PROVIDER_SITE_OTHER): Payer: BC Managed Care – PPO | Admitting: Internal Medicine

## 2024-01-03 VITALS — BP 119/83 | HR 89 | Ht 70.0 in | Wt 171.4 lb

## 2024-01-03 DIAGNOSIS — Z1211 Encounter for screening for malignant neoplasm of colon: Secondary | ICD-10-CM | POA: Diagnosis not present

## 2024-01-03 DIAGNOSIS — I10 Essential (primary) hypertension: Secondary | ICD-10-CM | POA: Diagnosis not present

## 2024-01-03 DIAGNOSIS — R7303 Prediabetes: Secondary | ICD-10-CM | POA: Diagnosis not present

## 2024-01-03 DIAGNOSIS — J45909 Unspecified asthma, uncomplicated: Secondary | ICD-10-CM | POA: Insufficient documentation

## 2024-01-03 DIAGNOSIS — Z0001 Encounter for general adult medical examination with abnormal findings: Secondary | ICD-10-CM | POA: Insufficient documentation

## 2024-01-03 DIAGNOSIS — J4520 Mild intermittent asthma, uncomplicated: Secondary | ICD-10-CM

## 2024-01-03 NOTE — Assessment & Plan Note (Signed)
 Annual physical completed today.  Previous records and labs reviewed. -Repeat labs ordered today -Cologuard ordered for colon cancer screening -We will tentatively plan for follow-up in 6 months

## 2024-01-03 NOTE — Patient Instructions (Signed)
 It was a pleasure to see you today.  Thank you for giving us  the opportunity to be involved in your care.  Below is a brief recap of your visit and next steps.  We will plan to see you again in 6 months.  Summary Annual physical completed today Repeat labs ordered Cologuard ordered Follow up in 6 months

## 2024-01-03 NOTE — Assessment & Plan Note (Signed)
 PFTs from June 2021 showed moderate obstructive airway disease-asthmatic type.  He is currently prescribed an albuterol inhaler for as needed use, which is infrequent.  He is asymptomatic today.  Pulmonary exam unremarkable.

## 2024-01-03 NOTE — Assessment & Plan Note (Signed)
 A1c 6.4 on labs from May.  He has focused on dietary changes in an effort to reduce his blood sugar.  Repeat A1c ordered today.

## 2024-01-03 NOTE — Assessment & Plan Note (Signed)
 Cologuard ordered today

## 2024-01-03 NOTE — Progress Notes (Signed)
 Complete physical exam  Patient: Andrew Ferguson   DOB: 11/04/1971   53 y.o. Male  MRN: [id number]  Subjective:    Chief Complaint  Patient presents with   Annual Exam    Andrew Ferguson is a 53 y.o. male who presents today for a complete physical exam. He reports consuming a general and has alpha-gal so avoids mammalian meats  diet. The patient does not participate in regular exercise at present. He generally feels fairly well. He reports sleeping fairly well. He does not have additional problems to discuss today.    Most recent fall risk assessment:    01/03/2024    8:02 AM  Fall Risk   Falls in the past year? 0  Number falls in past yr: 0  Injury with Fall? 0  Risk for fall due to : No Fall Risks  Follow up Falls evaluation completed     Most recent depression screenings:    01/03/2024    8:02 AM 10/10/2023   11:46 AM  PHQ 2/9 Scores  PHQ - 2 Score 0 0  PHQ- 9 Score  1   Vision:Within last year and Dental: No current dental problems and Receives regular dental care  Patient Active Problem List   Diagnosis Date Noted   Asthma 01/03/2024   Encounter for well adult exam with abnormal findings 01/03/2024   Prediabetes 01/03/2024   Colon cancer screening 01/03/2024   Alcohol abuse 10/20/2023   Panic disorder 10/12/2023   Seasonal and perennial allergic rhinitis 09/09/2023   Allergy  to alpha-gal 09/09/2023   Hymenoptera allergy  09/09/2023   Allergic urticaria 09/09/2023   Essential hypertension 05/25/2023   Tobacco use 05/25/2023   Pharyngeal dysphagia 02/28/2023   Past Medical History:  Diagnosis Date   Anaphylactic shock due to adverse food reaction 07/19/2023   Hypertension    Past Surgical History:  Procedure Laterality Date   TONSILLECTOMY     TYMPANOSTOMY TUBE PLACEMENT Bilateral    Family History  Problem Relation Age of Onset   Healthy Mother    Healthy Father    Allergic rhinitis Neg Hx    Angioedema Neg Hx    Asthma Neg Hx    Eczema Neg Hx     Allergies  Allergen Reactions   Alpha-Gal    Carrot Oil Hives   Codeine Swelling   Penicillins Swelling   Patient Care Team: Melvenia Manus BRAVO, MD as PCP - General (Internal Medicine) Mallipeddi, Diannah SQUIBB, MD as PCP - Cardiology (Cardiology) Shaaron Lamar HERO, MD as Consulting Physician (Gastroenterology)   Outpatient Medications Prior to Visit  Medication Sig   albuterol  (VENTOLIN  HFA) 108 (90 Base) MCG/ACT inhaler Inhale 2 puffs into the lungs every 4 (four) hours.   amLODipine  (NORVASC ) 5 MG tablet TAKE 1 TABLET (5 MG TOTAL) BY MOUTH DAILY.   EPINEPHrine  0.3 mg/0.3 mL IJ SOAJ injection INJECT 0.3 MG INTO THE MUSCLE AS NEEDED FOR ANAPHYLAXIS.   nebivolol  (BYSTOLIC ) 5 MG tablet Take 1 tablet (5 mg total) by mouth daily.   [DISCONTINUED] ALPRAZolam  (XANAX ) 0.25 MG tablet Take 1 tablet (0.25 mg total) by mouth 2 (two) times daily as needed for anxiety.   [DISCONTINUED] losartan  (COZAAR ) 50 MG tablet Take 1 tablet (50 mg total) by mouth daily.   [DISCONTINUED] Multiple Vitamin (MULTIVITAMIN) tablet Take 1 tablet by mouth daily. (Patient not taking: Reported on 10/19/2023)   No facility-administered medications prior to visit.   Review of Systems  Constitutional:  Negative for chills and fever.  HENT:  Negative for sore throat.   Respiratory:  Negative for cough and shortness of breath.   Cardiovascular:  Negative for chest pain, palpitations and leg swelling.  Gastrointestinal:  Negative for abdominal pain, blood in stool, constipation, diarrhea, nausea and vomiting.  Genitourinary:  Negative for dysuria and hematuria.  Musculoskeletal:  Negative for myalgias.  Skin:  Negative for itching and rash.  Neurological:  Negative for dizziness and headaches.  Psychiatric/Behavioral:  Negative for depression and suicidal ideas.       Objective:     BP 119/83 (BP Location: Left Arm, Patient Position: Sitting, Cuff Size: Normal)   Pulse 89   Ht 5' 4 (1.626 m)   Wt 171 lb 6.4 oz (77.7  kg)   SpO2 94%   BMI 29.42 kg/m  BP Readings from Last 3 Encounters:  01/03/24 119/83  11/11/23 116/80  10/19/23 98/68   Wt Readings from Last 3 Encounters:  01/03/24 171 lb 6.4 oz (77.7 kg)  11/11/23 164 lb 4 oz (74.5 kg)  10/19/23 162 lb (73.5 kg)   Physical Exam Vitals reviewed.  Constitutional:      General: He is not in acute distress.    Appearance: Normal appearance. He is not ill-appearing.  HENT:     Head: Normocephalic and atraumatic.     Right Ear: External ear normal.     Left Ear: External ear normal.     Nose: Nose normal. No congestion or rhinorrhea.     Mouth/Throat:     Mouth: Mucous membranes are moist.     Pharynx: Oropharynx is clear.  Eyes:     General: No scleral icterus.    Extraocular Movements: Extraocular movements intact.     Conjunctiva/sclera: Conjunctivae normal.     Pupils: Pupils are equal, round, and reactive to light.  Cardiovascular:     Rate and Rhythm: Normal rate and regular rhythm.     Pulses: Normal pulses.     Heart sounds: Normal heart sounds. No murmur heard. Pulmonary:     Effort: Pulmonary effort is normal.     Breath sounds: Normal breath sounds. No wheezing, rhonchi or rales.  Abdominal:     General: Abdomen is flat. Bowel sounds are normal. There is no distension.     Palpations: Abdomen is soft.     Tenderness: There is no abdominal tenderness.  Musculoskeletal:        General: No swelling or deformity. Normal range of motion.     Cervical back: Normal range of motion.  Skin:    General: Skin is warm and dry.     Capillary Refill: Capillary refill takes less than 2 seconds.  Neurological:     General: No focal deficit present.     Mental Status: He is alert and oriented to person, place, and time.     Motor: No weakness.  Psychiatric:        Mood and Affect: Mood normal.        Behavior: Behavior normal.        Thought Content: Thought content normal.    Last CBC Lab Results  Component Value Date   WBC 3.7  05/19/2023   HGB 15.7 05/19/2023   HCT 46.8 05/19/2023   MCV 97 05/19/2023   MCH 32.6 05/19/2023   RDW 11.9 05/19/2023   PLT 222 05/19/2023   Last metabolic panel Lab Results  Component Value Date   GLUCOSE 94 05/19/2023   NA 141 05/19/2023   K 4.5 05/19/2023  CL 102 05/19/2023   CO2 24 05/19/2023   BUN 6 05/19/2023   CREATININE 0.74 (L) 05/19/2023   EGFR 108 05/19/2023   CALCIUM 9.3 05/19/2023   PROT 6.5 05/19/2023   ALBUMIN 4.8 05/19/2023   LABGLOB 1.7 05/19/2023   AGRATIO 2.8 (H) 05/19/2023   BILITOT 1.1 05/19/2023   ALKPHOS 78 05/19/2023   AST 30 05/19/2023   ALT 40 05/19/2023   Last lipids Lab Results  Component Value Date   CHOL 173 05/19/2023   HDL 96 05/19/2023   LDLCALC 64 05/19/2023   TRIG 69 05/19/2023   CHOLHDL 1.8 05/19/2023   Last hemoglobin A1c Lab Results  Component Value Date   HGBA1C 6.4 (H) 05/19/2023   Last thyroid functions Lab Results  Component Value Date   TSH 0.610 05/19/2023   Last vitamin D  Lab Results  Component Value Date   VD25OH 27.1 (L) 05/19/2023   Last vitamin B12 and Folate Lab Results  Component Value Date   VITAMINB12 305 05/19/2023   FOLATE 8.2 05/19/2023        Assessment & Plan:    Routine Health Maintenance and Physical Exam  Immunization History  Administered Date(s) Administered   Tdap 07/08/2022    Health Maintenance  Topic Date Due   Colonoscopy  Never done   Zoster Vaccines- Shingrix (1 of 2) Never done   COVID-19 Vaccine (1 - 2024-25 season) Never done   INFLUENZA VACCINE  03/26/2024 (Originally 07/28/2023)   DTaP/Tdap/Td (2 - Td or Tdap) 07/08/2032   Hepatitis C Screening  Completed   HIV Screening  Completed   HPV VACCINES  Aged Out    Discussed health benefits of physical activity, and encouraged him to engage in regular exercise appropriate for his age and condition.  Problem List Items Addressed This Visit       Essential hypertension (Chronic)   Adequately controlled on current  regimen of amlodipine  5 mg daily and nebivolol  5 mg daily.  No changes are indicated today.      Asthma   PFTs from June 2021 showed moderate obstructive airway disease-asthmatic type.  He is currently prescribed an albuterol  inhaler for as needed use, which is infrequent.  He is asymptomatic today.  Pulmonary exam unremarkable.      Encounter for well adult exam with abnormal findings - Primary   Annual physical completed today.  Previous records and labs reviewed. -Repeat labs ordered today -Cologuard ordered for colon cancer screening -We will tentatively plan for follow-up in 6 months      Prediabetes   A1c 6.4 on labs from May.  He has focused on dietary changes in an effort to reduce his blood sugar.  Repeat A1c ordered today.      Colon cancer screening   Cologuard ordered today      Return in about 6 months (around 07/02/2024).  Manus FORBES Fireman, MD

## 2024-01-03 NOTE — Assessment & Plan Note (Signed)
 Adequately controlled on current regimen of amlodipine 5 mg daily and nebivolol 5 mg daily.  No changes are indicated today.

## 2024-01-04 LAB — CBC WITH DIFFERENTIAL/PLATELET
Basophils Absolute: 0.1 10*3/uL (ref 0.0–0.2)
Basos: 1 %
EOS (ABSOLUTE): 0.2 10*3/uL (ref 0.0–0.4)
Eos: 4 %
Hematocrit: 46.1 % (ref 37.5–51.0)
Hemoglobin: 16 g/dL (ref 13.0–17.7)
Immature Grans (Abs): 0 10*3/uL (ref 0.0–0.1)
Immature Granulocytes: 0 %
Lymphocytes Absolute: 1.6 10*3/uL (ref 0.7–3.1)
Lymphs: 38 %
MCH: 34.3 pg — ABNORMAL HIGH (ref 26.6–33.0)
MCHC: 34.7 g/dL (ref 31.5–35.7)
MCV: 99 fL — ABNORMAL HIGH (ref 79–97)
Monocytes Absolute: 0.5 10*3/uL (ref 0.1–0.9)
Monocytes: 11 %
Neutrophils Absolute: 1.9 10*3/uL (ref 1.4–7.0)
Neutrophils: 46 %
Platelets: 242 10*3/uL (ref 150–450)
RBC: 4.66 x10E6/uL (ref 4.14–5.80)
RDW: 11.8 % (ref 11.6–15.4)
WBC: 4.2 10*3/uL (ref 3.4–10.8)

## 2024-01-04 LAB — CMP14+EGFR
ALT: 26 [IU]/L (ref 0–44)
AST: 24 [IU]/L (ref 0–40)
Albumin: 4.7 g/dL (ref 3.8–4.9)
Alkaline Phosphatase: 79 [IU]/L (ref 44–121)
BUN/Creatinine Ratio: 12 (ref 9–20)
BUN: 10 mg/dL (ref 6–24)
Bilirubin Total: 1.3 mg/dL — ABNORMAL HIGH (ref 0.0–1.2)
CO2: 23 mmol/L (ref 20–29)
Calcium: 10 mg/dL (ref 8.7–10.2)
Chloride: 102 mmol/L (ref 96–106)
Creatinine, Ser: 0.86 mg/dL (ref 0.76–1.27)
Globulin, Total: 1.9 g/dL (ref 1.5–4.5)
Glucose: 89 mg/dL (ref 70–99)
Potassium: 4.5 mmol/L (ref 3.5–5.2)
Sodium: 139 mmol/L (ref 134–144)
Total Protein: 6.6 g/dL (ref 6.0–8.5)
eGFR: 103 mL/min/{1.73_m2} (ref >59)

## 2024-01-04 LAB — LIPID PANEL
Chol/HDL Ratio: 2.5 {ratio} (ref 0.0–5.0)
Cholesterol, Total: 206 mg/dL — ABNORMAL HIGH (ref 100–199)
HDL: 82 mg/dL (ref >39)
LDL Chol Calc (NIH): 107 mg/dL — ABNORMAL HIGH (ref 0–99)
Triglycerides: 95 mg/dL (ref 0–149)
VLDL Cholesterol Cal: 17 mg/dL (ref 5–40)

## 2024-01-04 LAB — B12 AND FOLATE PANEL
Folate: 10.6 ng/mL (ref >3.0)
Vitamin B-12: 281 pg/mL (ref 232–1245)

## 2024-01-04 LAB — HEMOGLOBIN A1C
Est. average glucose Bld gHb Est-mCnc: 123 mg/dL
Hgb A1c MFr Bld: 5.9 % — ABNORMAL HIGH (ref 4.8–5.6)

## 2024-01-04 LAB — TSH+FREE T4
Free T4: 1.39 ng/dL (ref 0.82–1.77)
TSH: 1.52 u[IU]/mL (ref 0.450–4.500)

## 2024-01-04 LAB — VITAMIN D 25 HYDROXY (VIT D DEFICIENCY, FRACTURES): Vit D, 25-Hydroxy: 25.9 ng/mL — ABNORMAL LOW (ref 30.0–100.0)

## 2024-01-09 ENCOUNTER — Encounter: Payer: Self-pay | Admitting: Allergy & Immunology

## 2024-01-10 ENCOUNTER — Telehealth: Payer: Self-pay

## 2024-01-10 NOTE — Telephone Encounter (Signed)
 Correction to kit name: it is [id number]  DOS: 08/19/23

## 2024-01-10 NOTE — Telephone Encounter (Signed)
 This makes more sense, as we have NEVER needed to get a PA for this lab test. I am sure that the patient was given an estimate of costs when he got the testing done and he could have declined. [facility] always prints off an estimate of the anticipated lab costs.   There is no way for us  to write off any [facility] charges. I would just try to get into a payment plan of some sort.   Marty Shaggy, MD [facility] 

## 2024-01-10 NOTE — Telephone Encounter (Signed)
 Patient's wife called back about Andrew Ferguson's [facility] bill. She said they are still getting bills from [facility] for $599 and he has been turned over to collections. She wants it written off because we didn't get a Prior Authorization for the test kit [id number]. I explained to her that I cannot write off [facility] bills. She said she has talked to [employer] multiple times and they told her all we need to do is do a Prior Authorization for this and then they will pay it. She has called [facility] multiple times and they say to call us . Can you help with this? Can our PA team work on this? Thank you.

## 2024-01-10 NOTE — Telephone Encounter (Signed)
 I have contacted the following regarding the [facility] test:  [employer]/[phone] - spoke to Em L./CSR - DOB verified -  stated without a procedure code there is NO way to tell whether or not a prior authorization is required; therefore, blood test [id number] - Kit (D816V) Digital PCR would not have required a prior authorization.  [phone] - spoke to LaShonda, [place] in the Oncology Dept - DOB verified - stated Test [phone] - Kit (D816V) Digital PCR did not require a prior authorization on their end.   Forwarding updated message to billing, provider and patient.

## 2024-01-10 NOTE — Telephone Encounter (Signed)
 I have never had to do a PA for this test. Can we call [employer] to see whether this needs to be done?   Malachi Bonds, MD [facility]

## 2024-01-11 ENCOUNTER — Encounter: Payer: Self-pay | Admitting: Internal Medicine

## 2024-01-19 ENCOUNTER — Encounter: Payer: Self-pay | Admitting: Allergy & Immunology

## 2024-01-23 NOTE — Telephone Encounter (Signed)
Patient's wife called this morning stating [facility] is requesting a different diagnosis code for this test. They state the one submitted does not fit the description of this test. Please see patient message in chart. If someone will send me a new diagnosis code for this test, I will call [facility] to see if they will accept the new code over the phone. Thank you.

## 2024-01-24 ENCOUNTER — Encounter (HOSPITAL_BASED_OUTPATIENT_CLINIC_OR_DEPARTMENT_OTHER): Payer: Self-pay | Admitting: Pulmonary Disease

## 2024-01-24 ENCOUNTER — Ambulatory Visit (HOSPITAL_BASED_OUTPATIENT_CLINIC_OR_DEPARTMENT_OTHER): Payer: BC Managed Care – PPO | Admitting: Pulmonary Disease

## 2024-01-24 VITALS — BP 106/64 | HR 70 | Resp 14 | Ht 70.0 in | Wt 170.0 lb

## 2024-01-24 DIAGNOSIS — R0683 Snoring: Secondary | ICD-10-CM

## 2024-01-24 DIAGNOSIS — R0609 Other forms of dyspnea: Secondary | ICD-10-CM

## 2024-01-24 NOTE — Telephone Encounter (Signed)
I talked to [facility] about it this morning.

## 2024-01-24 NOTE — Progress Notes (Signed)
Epworth Sleepiness Scale  Use the following scale to choose the most appropriate number for each situation. 0 Would never nod off 1  Slight  chance of nodding off 2 Moderate chance of nodding off 3 High chance of nodding off  Sitting and reading: 1 Watching TV: 1 Sitting, inactive, in a public place (e.g., in a meeting, theater, or dinner event): 0 As a passenger in a car for an hour or more without stopping for a break: 0 Lying down to rest when circumstances permit:1 Sitting and talking to someone: 0 Sitting quietly after a meal without alcohol: 1 In a car, while stopped for a few  minutes in traffic or at a light: 0  TOTOAL: 4  Loud snoring, witnessed apneas

## 2024-01-24 NOTE — Patient Instructions (Signed)
X Home sleep test  x schedule spirometry pre and post  Trial of nasal spray -Nasonex or Flonase -1 spray each nostril at bedtime

## 2024-01-24 NOTE — Progress Notes (Signed)
Subjective:    Patient ID: Andrew Ferguson, male    DOB: [birth year], 53 y.o.   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  He was recently diagnosed with severe alpha gal allergy 06/2023 and states he has quit smoking since then although he continues to chew. Reviewed allergy consultation Dr. Dellis Anes advised allergy shots. He was working at [employer] and attributes anaphylactic reaction to exposure to dog food.  He is currently working at [employer] as [employer], site. Accompanied by wife Lawson Fiscal who corroborates history and reports loud snoring.  She also states heavy breathing in the daytime.  He reports nasal congestion. Reports sleepiness score is 4. Bedtime is between 10 and 10:30 PM, sleep latency is a few minutes, he sleeps mostly on his back with 2 pillows reports 2-3 nocturnal awakenings including nocturia and is out of bed at 5:30 AM with mild dryness of mouth but denies somnolence. Weight is mostly unchanged There is no history suggestive of cataplexy, sleep paralysis or parasomnias     Past Medical History:  Diagnosis Date   Allergy to alpha-gal    Anaphylactic shock due to adverse food reaction 07/19/2023   Hypertension     Past Surgical History:  Procedure Laterality Date   TONSILLECTOMY     TYMPANOSTOMY TUBE PLACEMENT Bilateral     Allergies  Allergen Reactions   Alpha-Gal    Carrot Oil Hives   Codeine Swelling   Penicillins Swelling    Social History   Socioeconomic History   Marital status: Married    Spouse name: Not on file   Number of children: Not on file   Years of education: Not on file   Highest education level: Associate degree: occupational, Scientist, product/process development, or vocational program  Occupational History   Not on file  Tobacco Use   Smoking status: Some Days    Current packs/day: 0.25    Types:  Cigarettes   Smokeless tobacco: Current    Types: Chew   Tobacco comments:    Smokes cigarettes every now and then. Does Dip Tobacco-08/19/23  Vaping Use   Vaping status: Never Used  Substance and Sexual Activity   Alcohol use: Yes    Comment: 6-7 beers every other day    Drug use: No   Sexual activity: Not on file  Other Topics Concern   Not on file  Social History Narrative   Not on file   Social Drivers of Health   Financial Resource Strain: Low Risk  (10/07/2023)   Overall Financial Resource Strain (CARDIA)    Difficulty of Paying Living Expenses: Not hard at all  Food Insecurity: No Food Insecurity (10/07/2023)   Hunger Vital Sign    Worried About Running Out of Food in the Last Year: Never true    Ran Out of Food in the Last Year: Never true  Transportation Needs: No Transportation Needs (10/07/2023)   PRAPARE - Administrator, Civil Service (Medical): No    Lack of Transportation (Non-Medical): No  Physical Activity: Sufficiently Active (10/07/2023)   Exercise Vital Sign    Days of Exercise per Week: 7 days    Minutes of Exercise per Session: 150+ min  Stress: No Stress Concern Present (10/07/2023)   [employer] of Occupational Health - Occupational Stress Questionnaire    Feeling of Stress : Only a  little  Social Connections: Moderately Integrated (10/07/2023)   Social Connection and Isolation Panel [NHANES]    Frequency of Communication with Friends and Family: Never    Frequency of Social Gatherings with Friends and Family: More than three times a week    Attends Religious Services: More than 4 times per year    Active Member of [employer] or Organizations: No    Attends Engineer, structural: Not on file    Marital Status: Married  Catering manager Violence: Not on file    Family History  Problem Relation Age of Onset   Healthy Mother    Hypertension Father    Stroke Father    Allergic rhinitis Neg Hx    Angioedema Neg Hx    Asthma  Neg Hx    Eczema Neg Hx       Review of Systems Constitutional: negative for anorexia, fevers and sweats  Eyes: negative for irritation, redness and visual disturbance  Ears, nose, mouth, throat, and face: negative for earaches, epistaxis, nasal congestion and sore throat  Respiratory: negative for cough, dyspnea on exertion, sputum and wheezing  Cardiovascular: negative for chest pain, dyspnea, lower extremity edema, orthopnea, palpitations and syncope  Gastrointestinal: negative for abdominal pain, constipation, diarrhea, melena, nausea and vomiting  Genitourinary:negative for dysuria, frequency and hematuria  Hematologic/lymphatic: negative for bleeding, easy bruising and lymphadenopathy  Musculoskeletal:negative for arthralgias, muscle weakness and stiff joints  Neurological: negative for coordination problems, gait problems, headaches and weakness  Endocrine: negative for diabetic symptoms including polydipsia, polyuria and weight loss You may have to put that in anyways you may have to put that in yeah [id number] okay good    Objective:   Physical Exam  Gen. Pleasant, well-nourished, in no distress, normal affect ENT - no pallor,icterus, no post nasal drip, 2 mm overbite, narrow nasal passages Neck: No JVD, no thyromegaly, no carotid bruits Lungs: no use of accessory muscles, no dullness to percussion, clear without rales or rhonchi  Cardiovascular: Rhythm regular, heart sounds  normal, no murmurs or gallops, no peripheral edema Abdomen: soft and non-tender, no hepatosplenomegaly, BS normal. Musculoskeletal: No deformities, no cyanosis or clubbing Neuro:  alert, non focal       Assessment & Plan:   OSA -pretest probability is high.  He has a low set chin and overbite.  His nasal passages appeared narrow and congested.  Snoring could certainly be either nasal or oropharyngeal rather we will have to check for sleep apnea given history of witnessed pauses from his wife.  Home sleep  study may be adequate.  If he has significant sleep disordered breathing, we can suggest oral appliance and new CPAP only if severe. Meantime I have asked him to trial nasal steroids to see if he gets any relief  The pathophysiology of obstructive sleep apnea , it's cardiovascular consequences & modes of treatment including CPAP were discused with the patient in detail & they evidenced understanding.   Dyspnea on exertion and audible wheezing per wife -given his history of smoking we will check spirometry pre and post Chest x-ray 06/2021 does not show any evidence of lung disease except biapical scarring.  He does not qualify for lung cancer screening study

## 2024-02-04 DIAGNOSIS — G4730 Sleep apnea, unspecified: Secondary | ICD-10-CM | POA: Diagnosis not present

## 2024-02-04 DIAGNOSIS — R0683 Snoring: Secondary | ICD-10-CM

## 2024-02-09 ENCOUNTER — Ambulatory Visit (HOSPITAL_BASED_OUTPATIENT_CLINIC_OR_DEPARTMENT_OTHER): Payer: BC Managed Care – PPO | Admitting: Pulmonary Disease

## 2024-02-09 DIAGNOSIS — R0609 Other forms of dyspnea: Secondary | ICD-10-CM

## 2024-02-09 LAB — PULMONARY FUNCTION TEST
FEF 25-75 Post: 2.72 L/s
FEF 25-75 Pre: 1.46 L/s
FEF2575-%Change-Post: 87 %
FEF2575-%Pred-Post: 83 %
FEF2575-%Pred-Pre: 44 %
FEV1-%Change-Post: 30 %
FEV1-%Pred-Post: 85 %
FEV1-%Pred-Pre: 65 %
FEV1-Post: 3.26 L
FEV1-Pre: 2.5 L
FEV1FVC-%Change-Post: 13 %
FEV1FVC-%Pred-Pre: 75 %
FEV6-%Change-Post: 16 %
FEV6-%Pred-Post: 103 %
FEV6-%Pred-Pre: 88 %
FEV6-Post: 4.91 L
FEV6-Pre: 4.22 L
FEV6FVC-%Change-Post: 0 %
FEV6FVC-%Pred-Post: 103 %
FEV6FVC-%Pred-Pre: 102 %
FVC-%Change-Post: 15 %
FVC-%Pred-Post: 99 %
FVC-%Pred-Pre: 86 %
FVC-Post: 4.93 L
FVC-Pre: 4.28 L
Post FEV1/FVC ratio: 66 %
Post FEV6/FVC ratio: 99 %
Pre FEV1/FVC ratio: 58 %
Pre FEV6/FVC Ratio: 98 %

## 2024-02-09 NOTE — Progress Notes (Signed)
Pre/Post Spirometry Performed Today.

## 2024-02-09 NOTE — Patient Instructions (Signed)
Pre/Post Spirometry Performed Today.

## 2024-02-16 ENCOUNTER — Telehealth: Payer: Self-pay | Admitting: Pulmonary Disease

## 2024-02-16 DIAGNOSIS — R0683 Snoring: Secondary | ICD-10-CM

## 2024-02-16 NOTE — Telephone Encounter (Signed)
SNAP website states it is being processed

## 2024-02-16 NOTE — Telephone Encounter (Signed)
Patient completed HST almost 2 weeks ago and has not received any results----can you check and make sure S[employer] received the "box"--- call back [phone]

## 2024-02-19 DIAGNOSIS — R069 Unspecified abnormalities of breathing: Secondary | ICD-10-CM | POA: Diagnosis not present

## 2024-02-19 NOTE — Telephone Encounter (Signed)
 HST showed mod  OSA with AHI 18/ hr & low sat 78% Suggest autoCPAP  5-15 cm, mask of choice OV with me/APP in [place]  in 6 wks after starting

## 2024-02-20 ENCOUNTER — Encounter (HOSPITAL_BASED_OUTPATIENT_CLINIC_OR_DEPARTMENT_OTHER): Payer: Self-pay | Admitting: Pulmonary Disease

## 2024-02-20 NOTE — Telephone Encounter (Signed)
 HST showed mod  OSA with AHI 18/ hr & low sat 78% Suggest autoCPAP  5-15 cm, mask of choice OV with me/APP in [place]  in 6 wks after starting     Patient would like to see if he could do a night guard first? Or is sleep apnea to severe for a guard. Please advise. Patient would like to hold off with the CPAP for now if a night guard is possible  Call back [phone]- wife ok per [facility]

## 2024-02-20 NOTE — Telephone Encounter (Signed)
 Dr. Vassie Loll and Asher Muir please disregard. Patient called back after talking to his coworkers and would like to go ahead and proceed with CPAP machine.

## 2024-02-20 NOTE — Telephone Encounter (Signed)
 Would pt be a candidate for Night Guard I ordered CPAP and he was agreeable when we talked this morning before he called back and asked about the night guard

## 2024-02-20 NOTE — Telephone Encounter (Signed)
 Pt notified and order placed

## 2024-02-29 DIAGNOSIS — Z91014 Allergy to mammalian meats: Secondary | ICD-10-CM | POA: Diagnosis not present

## 2024-02-29 DIAGNOSIS — E7429 Other disorders of galactose metabolism: Secondary | ICD-10-CM | POA: Diagnosis not present

## 2024-03-06 DIAGNOSIS — G4733 Obstructive sleep apnea (adult) (pediatric): Secondary | ICD-10-CM | POA: Diagnosis not present

## 2024-03-19 ENCOUNTER — Other Ambulatory Visit: Payer: Self-pay

## 2024-03-19 ENCOUNTER — Other Ambulatory Visit: Payer: Self-pay | Admitting: Internal Medicine

## 2024-03-19 MED ORDER — ALBUTEROL SULFATE HFA 108 (90 BASE) MCG/ACT IN AERS: 2.0000 | INHALATION_SPRAY | RESPIRATORY_TRACT | 2 refills | Status: DC

## 2024-04-03 ENCOUNTER — Encounter: Payer: Self-pay | Admitting: Allergy & Immunology

## 2024-05-02 ENCOUNTER — Encounter: Payer: Self-pay | Admitting: Allergy & Immunology

## 2024-05-02 ENCOUNTER — Ambulatory Visit: Payer: Self-pay | Admitting: Allergy & Immunology

## 2024-05-02 VITALS — BP 110/90 | HR 74 | Temp 98.1°F | Resp 16 | Ht 68.5 in | Wt 168.5 lb

## 2024-05-02 DIAGNOSIS — J3089 Other allergic rhinitis: Secondary | ICD-10-CM

## 2024-05-02 DIAGNOSIS — Z91018 Allergy to other foods: Secondary | ICD-10-CM | POA: Diagnosis not present

## 2024-05-02 DIAGNOSIS — Z91038 Other insect allergy status: Secondary | ICD-10-CM

## 2024-05-02 DIAGNOSIS — T7800XD Anaphylactic reaction due to unspecified food, subsequent encounter: Secondary | ICD-10-CM | POA: Diagnosis not present

## 2024-05-02 DIAGNOSIS — J302 Other seasonal allergic rhinitis: Secondary | ICD-10-CM

## 2024-05-02 DIAGNOSIS — L501 Idiopathic urticaria: Secondary | ICD-10-CM

## 2024-05-02 NOTE — Progress Notes (Unsigned)
 FOLLOW UP  Date of Service/Encounter:  05/02/24   Assessment:   Allergic reaction due to alpha gal    Anaphylaxis to food (carrot)   Seasonal and perennial allergic rhinitis (grasses, trees, indoor molds, outdoor molds, dog, cockroach, and mixed feather)  Plan/Recommendations:   Allergic rhinitis (grasses, trees, indoor molds, outdoor molds, dog, cockroach, and mixed feather) - Continue in antihistamine once a day as needed for runny nose or itch - Continue Flonase 2 sprays in each nostril once a day as needed for stuffy nose - Consider saline nasal rinses as needed for nasal symptoms. Use this before any medicated nasal sprays for best result - We could consider allergy  shots for long term control.   Hives (urticaria) - CONTROLLED - This seems to be better since you avoided the red meat.  - Take the least amount of medications while remaining hive free Cetirizine (Zyrtec) 10mg  twice a day and famotidine  (Pepcid ) 20 mg twice a day. If no symptoms for 7-14 days then decrease to. Cetirizine (Zyrtec) 10mg  twice a day and famotidine  (Pepcid ) 20 mg once a day.  If no symptoms for 7-14 days then decrease to. Cetirizine (Zyrtec) 10mg  twice a day.  If no symptoms for 7-14 days then decrease to. Cetirizine (Zyrtec) 10mg  once a day.  Stinging insect allergy  (honeybee, yellow jacket, and paper wasp) - Continue to avoid stinging insects.   - EpiPen  is up to date.   Food allergy  - We will recheck the alpha gal allergy  to see where things trend.  - There is no scientific reason that acupuncture could work for this (most folks just lose it over time).  - EpiPen  is up to date.   Return in about 6 months (around 11/02/2024). You can have the follow up appointment with Dr. Idolina Maker or a Nurse Practicioner (our Nurse Practitioners are excellent and always have Physician oversight!).     Subjective:   Andrew Ferguson is a 53 y.o. male presenting today for follow up of  Chief Complaint   Patient presents with   Follow-up    Repeat labs for alpha gal.     Andrew Ferguson has a history of the following: Patient Active Problem List   Diagnosis Date Noted   Asthma 01/03/2024   Encounter for well adult exam with abnormal findings 01/03/2024   Prediabetes 01/03/2024   Colon cancer screening 01/03/2024   Alcohol abuse 10/20/2023   Panic disorder 10/12/2023   Seasonal and perennial allergic rhinitis 09/09/2023   Allergy  to alpha-gal 09/09/2023   Hymenoptera allergy  09/09/2023   Allergic urticaria 09/09/2023   Essential hypertension 05/25/2023   Tobacco use 05/25/2023   Pharyngeal dysphagia 02/28/2023    History obtained from: chart review and patient.  Discussed the use of AI scribe software for clinical note transcription with the patient and/or guardian, who gave verbal consent to proceed.  Andrew is a 53 y.o. male presenting for a follow up visit. He was last seen in November 2024.  At that time, he was continued on an antihistamine as well as Flonase.  For the hives, he was continued on suppressive antihistamines.  He continue to avoid stinging insects.  EpiPen  was refilled.  He continue to avoid red meats.  Xolair was discussed for management of food allergy .  Since last visit, he has done well.   He has a history of alpha-gal syndrome diagnosed in August of the previous year. He has been avoiding all red meats, which has led to an improvement in  symptoms. Previously, he experienced throat swelling when exposed to cooking bacon or sausage, but now he can be around these foods without symptoms. No recent tick bites have occurred, and his EpiPen  is up to date, with three boxes available for use at home, work, and another location. He is also allergic to stinging insects but does not recall any recent stings associated with his reactions. He did pursue acupuncture and his chiropractor asked that we order repeat alpha gal levels to see where they are trending.   He has been  cautious about using gelatin-containing medications and has been trying to avoid them. He [place] property [place] about [place], some of which [place], and he manages his hives by avoiding red meat. His seasonal allergies have been manageable with medication, and he previously received allergy  shots, which were effective for a long time. He has not had any sinus infections or flu this year.  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 He has not been taking pain medication, preferring to avoid them to protect his kidneys.   He had to give up his job at [employer], but he is now working back at [employer]. He did get a pay raise when he went back to [place] (compared to what he was making in the past). [employer] paid a lot better, but he could not tolerate the dog food exposure there, where he experienced anaphylaxis.    Otherwise, there have been no changes to his past medical history, surgical history, family history, or social history.    Review of systems otherwise negative other than that mentioned in the HPI.    Objective:   Blood pressure (!) 110/90, pulse 74, temperature 98.1 F (36.7 C), resp. rate 16, height 5' 8.5" (1.74 m), weight 168 lb 8 oz (76.4 kg), SpO2 97%. Body mass index is 25.24 kg/m.    Physical Exam Vitals reviewed.  Constitutional:      Appearance: He is well-developed.     Comments: Very pleasant.  Cooperative with the exam.  HENT:     Head: Normocephalic and atraumatic.     Right Ear: Tympanic membrane, ear canal and external ear normal. No drainage, swelling or tenderness. Tympanic membrane is not injected, scarred, erythematous, retracted or bulging.     Left Ear: Tympanic membrane, ear canal and external ear normal. No drainage, swelling or tenderness. Tympanic membrane is not injected, scarred, erythematous, retracted or bulging.     Nose: No nasal deformity, septal  deviation, mucosal edema or rhinorrhea.     Right Turbinates: Enlarged, swollen and pale.     Left Turbinates: Enlarged, swollen and pale.     Right Sinus: No maxillary sinus tenderness or frontal sinus tenderness.     Left Sinus: No maxillary sinus tenderness or frontal sinus tenderness.     Comments: No polyps noted.     Mouth/Throat:     Lips: Pink.     Mouth: Mucous membranes are moist. Mucous membranes are not pale and not dry.     Pharynx: Uvula midline.  Eyes:     General: Lids are normal. Allergic shiner present.        Right eye: No discharge.        Left eye: No discharge.     Conjunctiva/sclera: Conjunctivae normal.     Right eye: Right conjunctiva is not injected. No chemosis.    Left eye: Left conjunctiva  is not injected. No chemosis.    Pupils: Pupils are equal, round, and reactive to light.  Cardiovascular:     Rate and Rhythm: Normal rate and regular rhythm.     Heart sounds: Normal heart sounds.  Pulmonary:     Effort: Pulmonary effort is normal. No tachypnea, accessory muscle usage or respiratory distress.     Breath sounds: Normal breath sounds. No wheezing, rhonchi or rales.  Chest:     Chest wall: No tenderness.  Abdominal:     Tenderness: There is no abdominal tenderness. There is no guarding or rebound.  Lymphadenopathy:     Head:     Right side of head: No submandibular, tonsillar or occipital adenopathy.     Left side of head: No submandibular, tonsillar or occipital adenopathy.     Cervical: No cervical adenopathy.  Skin:    Coloration: Skin is not pale.     Findings: No abrasion, erythema, petechiae or rash. Rash is not papular, urticarial or vesicular.  Neurological:     Mental Status: He is alert.  Psychiatric:        Behavior: Behavior is cooperative.      Diagnostic studies: labs sent instead       Drexel Gentles, MD  [facility] 

## 2024-05-02 NOTE — Patient Instructions (Addendum)
 Allergic rhinitis (grasses, trees, indoor molds, outdoor molds, dog, cockroach, and mixed feather) - Continue in antihistamine once a day as needed for runny nose or itch - Continue Flonase 2 sprays in each nostril once a day as needed for stuffy nose - Consider saline nasal rinses as needed for nasal symptoms. Use this before any medicated nasal sprays for best result - We could consider allergy  shots for long term control.   Hives (urticaria) - CONTROLLED - This seems to be better since you avoided the red meat.  - Take the least amount of medications while remaining hive free Cetirizine (Zyrtec) 10mg  twice a day and famotidine  (Pepcid ) 20 mg twice a day. If no symptoms for 7-14 days then decrease to. Cetirizine (Zyrtec) 10mg  twice a day and famotidine  (Pepcid ) 20 mg once a day.  If no symptoms for 7-14 days then decrease to. Cetirizine (Zyrtec) 10mg  twice a day.  If no symptoms for 7-14 days then decrease to. Cetirizine (Zyrtec) 10mg  once a day.  Stinging insect allergy  (honeybee, yellow jacket, and paper wasp) - Continue to avoid stinging insects.   - EpiPen  is up to date.   Food allergy  - We will recheck the alpha gal allergy  to see where things trend.  - There is no scientific reason that acupuncture could work for this (most folks just lose it over time).  - EpiPen  is up to date.   Return in about 6 months (around 11/02/2024). You can have the follow up appointment with Dr. Idolina Maker or a Nurse Practicioner (our Nurse Practitioners are excellent and always have Physician oversight!).    Please inform us  of any Emergency Department visits, hospitalizations, or changes in symptoms. Call us  before going to the ED for breathing or allergy  symptoms since we might be able to fit you in for a sick visit. Feel free to contact us  anytime with any questions, problems, or concerns.  It was a pleasure to see you again today!  Websites that have reliable patient information: 1. American  Academy of Asthma, Allergy , and Immunology: www.aaaai.org 2. Food Allergy  Research and Education (FARE): foodallergy.org 3. Mothers of Asthmatics: http://www.asthmacommunitynetwork.org 4. American College of Allergy , Asthma, and Immunology: www.acaai.org      "Like" us  on Facebook and Instagram for our latest updates!      A healthy democracy works best when [employer] participate! Make sure you are registered to vote! If you have moved or changed any of your contact information, you will need to get this updated before voting! Scan the QR codes below to learn more!

## 2024-05-04 LAB — ALPHA-GAL PANEL
Allergen Lamb IgE: 0.56 kU/L — AB
Beef IgE: 0.91 kU/L — AB
IgE (Immunoglobulin E), Serum: 229 [IU]/mL (ref 6–495)
O215-IgE Alpha-Gal: 5.03 kU/L — AB
Pork IgE: <0.1 kU/L

## 2024-05-06 DIAGNOSIS — G4733 Obstructive sleep apnea (adult) (pediatric): Secondary | ICD-10-CM | POA: Diagnosis not present

## 2024-05-08 ENCOUNTER — Ambulatory Visit: Payer: Self-pay | Admitting: Allergy & Immunology

## 2024-05-08 ENCOUNTER — Encounter: Payer: Self-pay | Admitting: Allergy & Immunology

## 2024-05-16 ENCOUNTER — Ambulatory Visit: Payer: BLUE CROSS/BLUE SHIELD | Admitting: Family Medicine

## 2024-05-16 DIAGNOSIS — Z91014 Allergy to mammalian meats: Secondary | ICD-10-CM | POA: Diagnosis not present

## 2024-05-16 DIAGNOSIS — E7429 Other disorders of galactose metabolism: Secondary | ICD-10-CM | POA: Diagnosis not present

## 2024-05-24 ENCOUNTER — Encounter: Payer: Self-pay | Admitting: Allergy & Immunology

## 2024-06-06 DIAGNOSIS — G4733 Obstructive sleep apnea (adult) (pediatric): Secondary | ICD-10-CM | POA: Diagnosis not present

## 2024-06-11 ENCOUNTER — Other Ambulatory Visit: Payer: Self-pay | Admitting: Internal Medicine

## 2024-06-11 DIAGNOSIS — I10 Essential (primary) hypertension: Secondary | ICD-10-CM

## 2024-06-12 NOTE — Telephone Encounter (Signed)
 PLease disregard - error.

## 2024-06-14 ENCOUNTER — Other Ambulatory Visit: Payer: Self-pay

## 2024-06-14 DIAGNOSIS — Z91014 Allergy to mammalian meats: Secondary | ICD-10-CM

## 2024-06-15 DIAGNOSIS — I10 Essential (primary) hypertension: Secondary | ICD-10-CM | POA: Diagnosis not present

## 2024-06-15 DIAGNOSIS — G4733 Obstructive sleep apnea (adult) (pediatric): Secondary | ICD-10-CM | POA: Diagnosis not present

## 2024-06-17 LAB — ALPHA-GAL PANEL
Allergen Lamb IgE: 0.62 kU/L — AB
Beef IgE: 1 kU/L — AB
IgE (Immunoglobulin E), Serum: 229 [IU]/mL (ref 6–495)
O215-IgE Alpha-Gal: 6.45 kU/L — AB
Pork IgE: 0.13 kU/L — AB

## 2024-07-02 ENCOUNTER — Ambulatory Visit: Payer: BC Managed Care – PPO | Admitting: Internal Medicine

## 2024-07-02 ENCOUNTER — Encounter: Payer: Self-pay | Admitting: Internal Medicine

## 2024-07-02 ENCOUNTER — Ambulatory Visit: Admitting: Internal Medicine

## 2024-07-02 VITALS — BP 127/86 | HR 86 | Ht 70.0 in | Wt 172.4 lb

## 2024-07-02 DIAGNOSIS — R7303 Prediabetes: Secondary | ICD-10-CM | POA: Diagnosis not present

## 2024-07-02 DIAGNOSIS — E782 Mixed hyperlipidemia: Secondary | ICD-10-CM

## 2024-07-02 DIAGNOSIS — J4520 Mild intermittent asthma, uncomplicated: Secondary | ICD-10-CM

## 2024-07-02 DIAGNOSIS — E559 Vitamin D deficiency, unspecified: Secondary | ICD-10-CM

## 2024-07-02 DIAGNOSIS — I10 Essential (primary) hypertension: Secondary | ICD-10-CM | POA: Diagnosis not present

## 2024-07-02 DIAGNOSIS — Z91018 Allergy to other foods: Secondary | ICD-10-CM

## 2024-07-02 DIAGNOSIS — Z125 Encounter for screening for malignant neoplasm of prostate: Secondary | ICD-10-CM | POA: Insufficient documentation

## 2024-07-02 MED ORDER — AMLODIPINE BESYLATE 5 MG PO TABS: 5.0000 mg | ORAL_TABLET | Freq: Every day | ORAL | 3 refills | Status: AC

## 2024-07-02 NOTE — Progress Notes (Signed)
 Established Patient Office Visit  Subjective:  Patient ID: Andrew B Tripoli, male    DOB: 1970/12/30  Age: 53 y.o. MRN: [id number]  CC:  Chief Complaint  Patient presents with   Medical Management of Chronic Issues    6 month f/u. Previously seen by Dr. Melvenia.     HPI Andrew Ferguson is a 53 y.o. male with past medical history of HTN, allergic rhinitis and tobacco use who presents for f/u of his chronic medical conditions.  HTN: BP is well-controlled. Takes medications regularly. Patient denies headache, dizziness, chest pain, dyspnea or palpitations.  Allergic rhinitis and allergy  to Alphagan: Followed by allergy  clinic.  He keeps EpiPen  for history of anaphylactic reaction.  He also has albuterol  inhaler for as needed use, but rarely needs it.  Past Medical History:  Diagnosis Date   Allergy  to alpha-gal    Anaphylactic shock due to adverse food reaction 07/19/2023   Hypertension     Past Surgical History:  Procedure Laterality Date   TONSILLECTOMY     TYMPANOSTOMY TUBE PLACEMENT Bilateral     Family History  Problem Relation Age of Onset   Healthy Mother    Hypertension Father    Stroke Father    Allergic rhinitis Neg Hx    Angioedema Neg Hx    Asthma Neg Hx    Eczema Neg Hx     Social History   Socioeconomic History   Marital status: Married    Spouse name: Not on file   Number of children: Not on file   Years of education: Not on file   Highest education level: Associate degree: occupational, Scientist, product/process development, or vocational program  Occupational History   Not on file  Tobacco Use   Smoking status: Some Days    Current packs/day: 0.25    Types: Cigarettes   Smokeless tobacco: Current    Types: Chew   Tobacco comments:    Smokes cigarettes every now and then. Does Dip Tobacco-08/19/23  Vaping Use   Vaping status: Never Used  Substance and Sexual Activity   Alcohol use: Yes    Comment: 6-7 beers every other day    Drug use: No   Sexual activity: Not on file   Other Topics Concern   Not on file  Social History Narrative   Not on file   Social Drivers of Health   Financial Resource Strain: Low Risk  (10/07/2023)   Overall Financial Resource Strain (CARDIA)    Difficulty of Paying Living Expenses: Not hard at all  Food Insecurity: No Food Insecurity (10/07/2023)   Hunger Vital Sign    Worried About Running Out of Food in the Last Year: Never true    Ran Out of Food in the Last Year: Never true  Transportation Needs: No Transportation Needs (10/07/2023)   PRAPARE - Administrator, Civil Service (Medical): No    Lack of Transportation (Non-Medical): No  Physical Activity: Sufficiently Active (10/07/2023)   Exercise Vital Sign    Days of Exercise per Week: 7 days    Minutes of Exercise per Session: 150+ min  Stress: No Stress Concern Present (10/07/2023)   [employer] of Occupational Health - Occupational Stress Questionnaire    Feeling of Stress : Only a little  Social Connections: Moderately Integrated (10/07/2023)   Social Connection and Isolation Panel    Frequency of Communication with Friends and Family: Never    Frequency of Social Gatherings with Friends and Family: More than three times  a week    Attends Religious Services: More than 4 times per year    Active Member of Clubs or Organizations: No    Attends Engineer, structural: Not on file    Marital Status: Married  Catering manager Violence: Not on file    Outpatient Medications Prior to Visit  Medication Sig Dispense Refill   albuterol  (VENTOLIN  HFA) 108 (90 Base) MCG/ACT inhaler Inhale 2 puffs into the lungs every 4 (four) hours. 6.7 g 2   EPINEPHrine  0.3 mg/0.3 mL IJ SOAJ injection INJECT 0.3 MG INTO THE MUSCLE AS NEEDED FOR ANAPHYLAXIS. 6 each 0   nebivolol  (BYSTOLIC ) 5 MG tablet TAKE 1 TABLET (5 MG TOTAL) BY MOUTH DAILY. 90 tablet 3   amLODipine  (NORVASC ) 5 MG tablet TAKE 1 TABLET (5 MG TOTAL) BY MOUTH DAILY. 90 tablet 2   No  facility-administered medications prior to visit.    Allergies  Allergen Reactions   Alpha-Gal    Carrot Oil Hives   Codeine Swelling   Penicillins Swelling    ROS Review of Systems  Constitutional:  Negative for chills and fever.  HENT:  Negative for congestion and sore throat.   Eyes:  Negative for pain and discharge.  Respiratory:  Negative for cough and shortness of breath.   Cardiovascular:  Negative for chest pain and palpitations.  Gastrointestinal:  Negative for constipation, diarrhea, nausea and vomiting.  Endocrine: Negative for polydipsia and polyuria.  Genitourinary:  Negative for dysuria and hematuria.  Musculoskeletal:  Negative for neck pain and neck stiffness.  Skin:  Negative for rash.  Neurological:  Negative for dizziness, weakness, numbness and headaches.  Psychiatric/Behavioral:  Negative for agitation and behavioral problems.       Objective:    Physical Exam Vitals reviewed.  Constitutional:      General: He is not in acute distress.    Appearance: He is not diaphoretic.  HENT:     Head: Normocephalic and atraumatic.     Nose: Nose normal.     Mouth/Throat:     Mouth: Mucous membranes are moist.  Eyes:     General: No scleral icterus.    Extraocular Movements: Extraocular movements intact.  Cardiovascular:     Rate and Rhythm: Normal rate and regular rhythm.     Heart sounds: No murmur heard. Pulmonary:     Breath sounds: Normal breath sounds. No wheezing or rales.  Musculoskeletal:     Cervical back: Neck supple. No tenderness.     Right lower leg: No edema.     Left lower leg: No edema.  Skin:    General: Skin is warm.     Findings: No rash.  Neurological:     General: No focal deficit present.     Mental Status: He is alert and oriented to person, place, and time.     Sensory: No sensory deficit.     Motor: No weakness.  Psychiatric:        Mood and Affect: Mood normal.        Behavior: Behavior normal.     BP 127/86   Pulse  86   Ht 5' 10 (1.778 m)   Wt 172 lb 6.4 oz (78.2 kg)   SpO2 93%   BMI 24.74 kg/m  Wt Readings from Last 3 Encounters:  07/02/24 172 lb 6.4 oz (78.2 kg)  05/02/24 168 lb 8 oz (76.4 kg)  02/09/24 171 lb 9.6 oz (77.8 kg)    Lab Results  Component Value Date  TSH 1.520 01/03/2024   Lab Results  Component Value Date   WBC 4.2 01/03/2024   HGB 16.0 01/03/2024   HCT 46.1 01/03/2024   MCV 99 (H) 01/03/2024   PLT 242 01/03/2024   Lab Results  Component Value Date   NA 139 01/03/2024   K 4.5 01/03/2024   CO2 23 01/03/2024   GLUCOSE 89 01/03/2024   BUN 10 01/03/2024   CREATININE 0.86 01/03/2024   BILITOT 1.3 (H) 01/03/2024   ALKPHOS 79 01/03/2024   AST 24 01/03/2024   ALT 26 01/03/2024   PROT 6.6 01/03/2024   ALBUMIN 4.7 01/03/2024   CALCIUM 10.0 01/03/2024   EGFR 103 01/03/2024   Lab Results  Component Value Date   CHOL 206 (H) 01/03/2024   Lab Results  Component Value Date   HDL 82 01/03/2024   Lab Results  Component Value Date   LDLCALC 107 (H) 01/03/2024   Lab Results  Component Value Date   TRIG 95 01/03/2024   Lab Results  Component Value Date   CHOLHDL 2.5 01/03/2024   Lab Results  Component Value Date   HGBA1C 5.9 (H) 01/03/2024      Assessment & Plan:   Problem List Items Addressed This Visit       Cardiovascular and Mediastinum   Essential hypertension - Primary (Chronic)   BP Readings from Last 1 Encounters:  07/02/24 127/86   Well-controlled with Amlodipine  5 mg QD and Bystolic  5 mg QD Counseled for compliance with the medications Advised DASH diet and moderate exercise/walking, at least 150 mins/week       Relevant Medications   amLODipine  (NORVASC ) 5 MG tablet   Other Relevant Orders   TSH   CMP14+EGFR   CBC with Differential/Platelet     Respiratory   Asthma   Overall well-controlled with albuterol  as needed      Relevant Orders   CBC with Differential/Platelet     Other   Allergy  to alpha-gal   Has been  avoiding red meat Followed by allergy  and immunology clinic      Prediabetes   Lab Results  Component Value Date   HGBA1C 5.9 (H) 01/03/2024   Advised to follow DASH diet for now      Relevant Orders   Hemoglobin A1c   CMP14+EGFR   Prostate cancer screening   Ordered PSA after discussing its limitations for prostate cancer screening, including false positive results leading to additional investigations.      Relevant Orders   PSA   Other Visit Diagnoses       Mixed hyperlipidemia       Relevant Medications   amLODipine  (NORVASC ) 5 MG tablet   Other Relevant Orders   Lipid panel     Vitamin D  deficiency       Relevant Orders   VITAMIN D  25 Hydroxy (Vit-D Deficiency, Fractures)       Meds ordered this encounter  Medications   amLODipine  (NORVASC ) 5 MG tablet    Sig: Take 1 tablet (5 mg total) by mouth daily.    Dispense:  90 tablet    Refill:  3    Follow-up: Return in about 6 months (around 01/02/2025) for Annual physical.    Suzzane MARLA Blanch, MD

## 2024-07-02 NOTE — Assessment & Plan Note (Signed)
 Lab Results  Component Value Date   HGBA1C 5.9 (H) 01/03/2024   Advised to follow DASH diet for now

## 2024-07-02 NOTE — Assessment & Plan Note (Signed)
 Has been avoiding red meat Followed by allergy  and immunology clinic

## 2024-07-02 NOTE — Assessment & Plan Note (Signed)
 Overall well-controlled with albuterol  as needed

## 2024-07-02 NOTE — Assessment & Plan Note (Signed)
 Ordered PSA after discussing its limitations for prostate cancer screening, including false positive results leading to additional investigations.

## 2024-07-02 NOTE — Patient Instructions (Signed)
 Please continue to take medications as prescribed.  Please continue to follow DASH diet and perform moderate exercise/walking at least 150 mins/week.  Please get fasting blood tests done before the next visit.

## 2024-07-02 NOTE — Assessment & Plan Note (Signed)
 BP Readings from Last 1 Encounters:  07/02/24 127/86   Well-controlled with Amlodipine  5 mg QD and Bystolic  5 mg QD Counseled for compliance with the medications Advised DASH diet and moderate exercise/walking, at least 150 mins/week

## 2024-08-08 ENCOUNTER — Ambulatory Visit: Payer: Self-pay | Admitting: Family Medicine

## 2024-08-17 ENCOUNTER — Encounter: Payer: Self-pay | Admitting: Radiology

## 2024-09-05 ENCOUNTER — Other Ambulatory Visit: Payer: Self-pay

## 2024-09-05 DIAGNOSIS — E559 Vitamin D deficiency, unspecified: Secondary | ICD-10-CM

## 2024-09-05 DIAGNOSIS — I10 Essential (primary) hypertension: Secondary | ICD-10-CM

## 2024-09-05 DIAGNOSIS — J4520 Mild intermittent asthma, uncomplicated: Secondary | ICD-10-CM

## 2024-09-05 DIAGNOSIS — Z125 Encounter for screening for malignant neoplasm of prostate: Secondary | ICD-10-CM

## 2024-09-05 DIAGNOSIS — E782 Mixed hyperlipidemia: Secondary | ICD-10-CM

## 2024-09-05 DIAGNOSIS — R7303 Prediabetes: Secondary | ICD-10-CM

## 2024-09-05 DIAGNOSIS — Z91018 Allergy to other foods: Secondary | ICD-10-CM

## 2024-09-05 NOTE — Addendum Note (Signed)
 Addended by: NEWCOMER MCCLAIN, Darriona Dehaas L on: 09/05/2024 07:53 AM   Modules accepted: Orders

## 2024-09-07 LAB — ALPHA-GAL PANEL
Allergen Lamb IgE: 0.76 kU/L — AB
Beef IgE: 0.83 kU/L — AB
IgE (Immunoglobulin E), Serum: 304 [IU]/mL (ref 6–495)
O215-IgE Alpha-Gal: 5.96 kU/L — AB
Pork IgE: 0.12 kU/L — AB

## 2024-09-09 ENCOUNTER — Ambulatory Visit: Payer: Self-pay | Admitting: Medical

## 2024-09-17 ENCOUNTER — Other Ambulatory Visit: Payer: Self-pay | Admitting: Internal Medicine

## 2024-09-17 DIAGNOSIS — Z87892 Personal history of anaphylaxis: Secondary | ICD-10-CM

## 2024-09-20 ENCOUNTER — Encounter (HOSPITAL_BASED_OUTPATIENT_CLINIC_OR_DEPARTMENT_OTHER): Payer: Self-pay | Admitting: Pulmonary Disease

## 2024-09-21 ENCOUNTER — Ambulatory Visit (HOSPITAL_BASED_OUTPATIENT_CLINIC_OR_DEPARTMENT_OTHER): Admitting: Pulmonary Disease

## 2024-10-15 DIAGNOSIS — I10 Essential (primary) hypertension: Secondary | ICD-10-CM | POA: Diagnosis not present

## 2024-10-29 ENCOUNTER — Encounter: Payer: Self-pay | Admitting: Radiology

## 2024-11-02 ENCOUNTER — Other Ambulatory Visit: Payer: Self-pay | Admitting: Internal Medicine

## 2024-11-09 ENCOUNTER — Encounter: Payer: Self-pay | Admitting: Family Medicine

## 2024-11-09 ENCOUNTER — Ambulatory Visit: Admitting: Family Medicine

## 2024-11-09 ENCOUNTER — Other Ambulatory Visit: Payer: Self-pay

## 2024-11-09 ENCOUNTER — Ambulatory Visit: Admitting: Allergy & Immunology

## 2024-11-09 VITALS — BP 110/84 | HR 97 | Temp 98.7°F | Resp 18 | Wt 174.6 lb

## 2024-11-09 DIAGNOSIS — J302 Other seasonal allergic rhinitis: Secondary | ICD-10-CM

## 2024-11-09 DIAGNOSIS — L501 Idiopathic urticaria: Secondary | ICD-10-CM

## 2024-11-09 DIAGNOSIS — Z91018 Allergy to other foods: Secondary | ICD-10-CM

## 2024-11-09 DIAGNOSIS — Z91038 Other insect allergy status: Secondary | ICD-10-CM

## 2024-11-09 DIAGNOSIS — T7800XD Anaphylactic reaction due to unspecified food, subsequent encounter: Secondary | ICD-10-CM

## 2024-11-09 DIAGNOSIS — J3089 Other allergic rhinitis: Secondary | ICD-10-CM | POA: Diagnosis not present

## 2024-11-09 NOTE — Progress Notes (Signed)
 [address] [place] [address] Dept: [phone]  FOLLOW UP NOTE  Patient ID: Andrew Ferguson, male    DOB: 1971/09/17  Age: 53 y.o. MRN: [id number] Date of Office Visit: 11/09/2024  Assessment  Chief Complaint: Follow-up (Pt presents to the office to see if he could be skin pricked for meat allergy . Had already done blood work.)  HPI Andrew Ferguson is a 53 year old male who presents to the clinic for a follow up visit. He was last seen in this clinic on 05/02/2024 by Dr. Iva for evaluation of allergic rhinitis, food allergy  to carrot, urticaria, alpha gal allergy , and stinging insect allergy .   Discussed the use of AI scribe software for clinical note transcription with the patient, who gave verbal consent to proceed.  History of Present Illness Andrew Ferguson is a 53 year old male with alpha-gal syndrome who presents for evaluation of his allergy  status and consideration of a food challenge.  He has a history of alpha-gal syndrome, which has previously caused throat swelling and urticaria, particularly when exposed to mammalian meat dust at his workplace, [employer]. He has avoided consuming mammalian meats since his diagnosis and has not had any accidental exposures. He is interested in determining if his allergy  has improved enough to reintroduce mammalian meats into his diet, as he misses eating burgers. His last alpha gal lab collected by his PCP on 09/05/2024 indicated alpha gal IgE 5.96 with positive components. This has decreased significantly over the last year from 51.80.   He manages his diet by consuming turkey, chicken, and fish, and has not experienced any issues with dairy products. He is unsure if pork was ever a problem but has avoided it as a precaution. He has not had any new tick bites, which are known to exacerbate alpha-gal syndrome.  His seasonal allergies have been worse this year, with symptoms including coughing, sneezing, nasal congestion, and  rhinorrhea. He takes Zyrtec daily to manage these symptoms but does not use nasal sprays regularly. He continues to avoid carrots despite being told he is no longer allergic to them. No recent urticaria or additional tick bites. He continues to avoid stinging insects with no stinging incidences since his last visit to this clinic.  EpiPen  sent is up-to-date.  His current medications are listed in the chart.   Drug Allergies:  Allergies  Allergen Reactions   Alpha-Gal    Carrot Oil Hives   Codeine Swelling   Penicillins Swelling    Physical Exam: BP 110/84 (BP Location: Left Arm, Patient Position: Sitting, Cuff Size: Normal)   Pulse 97   Temp 98.7 F (37.1 C) (Temporal)   Resp 18   Wt 174 lb 9.6 oz (79.2 kg)   SpO2 97%   BMI 25.05 kg/m    Physical Exam Vitals reviewed.  Constitutional:      Appearance: Normal appearance.  HENT:     Head: Normocephalic and atraumatic.     Right Ear: Tympanic membrane normal.     Left Ear: Tympanic membrane normal.     Nose:     Comments: Bilateral nares slightly erythematous with thin clear nasal drainage noted.  Pharynx normal.  Ears normal.  Eyes normal.    Mouth/Throat:     Pharynx: Oropharynx is clear.  Eyes:     Conjunctiva/sclera: Conjunctivae normal.  Cardiovascular:     Rate and Rhythm: Normal rate and regular rhythm.     Heart sounds: Normal heart sounds. No murmur heard.  Pulmonary:     Effort: Pulmonary effort is normal.     Breath sounds: Normal breath sounds.     Comments: Lungs clear to auscultation Musculoskeletal:        General: Normal range of motion.     Cervical back: Normal range of motion and neck supple.  Skin:    General: Skin is warm.  Neurological:     Mental Status: He is alert and oriented to person, place, and time.  Psychiatric:        Mood and Affect: Mood normal.        Behavior: Behavior normal.        Thought Content: Thought content normal.        Judgment: Judgment normal.      Diagnostics: Selected foods negative to beef, pork, and lamb with adequate controls.   Assessment and Plan: 1. Seasonal and perennial allergic rhinitis   2. Anaphylactic shock due to food, subsequent encounter   3. Allergy  to alpha-gal   4. Idiopathic urticaria   5. Hymenoptera allergy      Patient Instructions  Allergic rhinitis Continue allergen avoidance measures directed toward grass pollen, tree pollen, indoor mold, outdoor mold,  dog, cockroach, and mixed feathers  as listed below continue an antihistamine once a day if needed for runny nose or itch Continue Flonase 1 spray in each nostril once a day if needed for stuffy nose Consider saline nasal rinses as needed for nasal symptoms. Use this before any medicated nasal sprays for best result Consider allergen immunotherapy if your symptoms are not well-controlled with the treatment plan as listed above  Food allergy  Your skin testing to pork, beef, and lamb was negative at today's visit.  Make an appointment for food challenge for alpha gal allergy  if interested. You may introduce carrot into your diet as skin testing was negative  Alpha gal Continue to avoid mammalian meats.  In case of an allergic reaction, take cetirizine 10 mg once every 12-24 hours, and if life-threatening symptoms occur, inject with EpiPen  0.3 mg. If interested, make an appointment for an alpha gal challenge in the clinic.  Remember to stop your antihistamines for 3 days before that alpha gal challenge appointment.  The alpha gal challenge appointment will take 6 hours of testing time.    Hives (urticaria) Take the least amount of medications while remaining hive free Cetirizine (Zyrtec) 10mg  twice a day and famotidine  (Pepcid ) 20 mg twice a day. If no symptoms for 7-14 days then decrease to. Cetirizine (Zyrtec) 10mg  twice a day and famotidine  (Pepcid ) 20 mg once a day.  If no symptoms for 7-14 days then decrease to. Cetirizine (Zyrtec) 10mg  twice a  day.  If no symptoms for 7-14 days then decrease to. Cetirizine (Zyrtec) 10mg  once a day.  May use Benadryl  (diphenhydramine ) as needed for breakthrough hives       If symptoms return, then step up dosage  Keep a detailed symptom journal including foods eaten, contact with allergens, medications taken, weather changes.    Stinging insect allergy  Continue to avoid stinging insects In case of an allergic reaction, take cetirizine 10 mg once every 12-24 hours, and if life-threatening symptoms occur, inject with EpiPen  0.3 mg.  Call the clinic if this treatment plan is not working well for you.  Follow up in 6 months or sooner if needed.   Return in about 6 months (around 05/09/2025), or if symptoms worsen or fail to improve.    Thank you for the opportunity  to care for this patient.  Please do not hesitate to contact me with questions.  Arlean Mutter, FNP [facility] 

## 2024-11-09 NOTE — Patient Instructions (Signed)
 Allergic rhinitis Continue allergen avoidance measures directed toward grass pollen, tree pollen, indoor mold, outdoor mold,  dog, cockroach, and mixed feathers  as listed below continue an antihistamine once a day if needed for runny nose or itch Continue Flonase 1 spray in each nostril once a day if needed for stuffy nose Consider saline nasal rinses as needed for nasal symptoms. Use this before any medicated nasal sprays for best result Consider allergen immunotherapy if your symptoms are not well-controlled with the treatment plan as listed above  Food allergy  Your skin testing to pork, beef, and lamb was negative at today's visit.  Make an appointment for food challenge for alpha gal allergy  if interested. You may introduce carrot into your diet as skin testing was negative  Alpha gal Continue to avoid mammalian meats.  In case of an allergic reaction, take cetirizine 10 mg once every 12-24 hours, and if life-threatening symptoms occur, inject with EpiPen  0.3 mg. If interested, make an appointment for an alpha gal challenge in the clinic.  Remember to stop your antihistamines for 3 days before that alpha gal challenge appointment.  The alpha gal challenge appointment will take 6 hours of testing time.    Hives (urticaria) Take the least amount of medications while remaining hive free Cetirizine (Zyrtec) 10mg  twice a day and famotidine  (Pepcid ) 20 mg twice a day. If no symptoms for 7-14 days then decrease to. Cetirizine (Zyrtec) 10mg  twice a day and famotidine  (Pepcid ) 20 mg once a day.  If no symptoms for 7-14 days then decrease to. Cetirizine (Zyrtec) 10mg  twice a day.  If no symptoms for 7-14 days then decrease to. Cetirizine (Zyrtec) 10mg  once a day.  May use Benadryl  (diphenhydramine ) as needed for breakthrough hives       If symptoms return, then step up dosage  Keep a detailed symptom journal including foods eaten, contact with allergens, medications taken, weather changes.     Stinging insect allergy  Continue to avoid stinging insects In case of an allergic reaction, take cetirizine 10 mg once every 12-24 hours, and if life-threatening symptoms occur, inject with EpiPen  0.3 mg.  Call the clinic if this treatment plan is not working well for you.  Follow up in 6 months or sooner if needed.  Reducing Pollen Exposure The American Academy of Allergy , Asthma and Immunology suggests the following steps to reduce your exposure to pollen during allergy  seasons. Do not hang sheets or clothing out to dry; pollen may collect on these items. Do not mow lawns or spend time around freshly cut grass; mowing stirs up pollen. Keep windows closed at night.  Keep car windows closed while driving. Minimize morning activities outdoors, a time when pollen counts are usually at their highest. Stay indoors as much as possible when pollen counts or humidity is high and on windy days when pollen tends to remain in the air longer. Use air conditioning when possible.  Many air conditioners have filters that trap the pollen spores. Use a HEPA room air filter to remove pollen form the indoor air you breathe.  Control of Dog or Cat Allergen Avoidance is the best way to manage a dog or cat allergy . If you have a dog or cat and are allergic to dog or cats, consider removing the dog or cat from the home. If you have a dog or cat but don't want to find it a new home, or if your family wants a pet even though someone in the household is allergic, here are  some strategies that may help keep symptoms at bay:  Keep the pet out of your bedroom and restrict it to only a few rooms. Be advised that keeping the dog or cat in only one room will not limit the allergens to that room. Don't pet, hug or kiss the dog or cat; if you do, wash your hands with soap and water. High-efficiency particulate air (HEPA) cleaners run continuously in a bedroom or living room can reduce allergen levels over time. Regular use  of a high-efficiency vacuum cleaner or a central vacuum can reduce allergen levels. Giving your dog or cat a bath at least once a week can reduce airborne allergen.  Control of Mold Allergen Mold and fungi can grow on a variety of surfaces provided certain temperature and moisture conditions exist.  Outdoor molds grow on plants, decaying vegetation and soil.  The major outdoor mold, Alternaria and Cladosporium, are found in very high numbers during hot and dry conditions.  Generally, a late Summer - Fall peak is seen for common outdoor fungal spores.  Rain will temporarily lower outdoor mold spore count, but counts rise rapidly when the rainy period ends.  The most important indoor molds are Aspergillus and Penicillium.  Dark, humid and poorly ventilated basements are ideal sites for mold growth.  The next most common sites of mold growth are the bathroom and the kitchen.  Outdoor [employer] Use air conditioning and keep windows closed Avoid exposure to decaying vegetation. Avoid leaf raking. Avoid grain handling. Consider wearing a face mask if working in moldy areas.  Indoor Mold Control Maintain humidity below 50%. Clean washable surfaces with 5% bleach solution. Remove sources e.g. Contaminated carpets.  Control of Cockroach Allergen Cockroach allergen has been identified as an important cause of acute attacks of asthma, especially in urban settings.  There are fifty-five species of cockroach that exist in the [place] , however only three, the American, German and Oriental species produce allergen that can affect patients with Asthma.  Allergens can be obtained from fecal particles, egg casings and secretions from cockroaches.    Remove food sources. Reduce access to water. Seal access and entry points. Spray runways with 0.5-1% Diazinon or Chlorpyrifos Blow boric acid power under stoves and refrigerator. Place bait stations (hydramethylnon) at feeding sites.

## 2024-11-09 NOTE — Progress Notes (Deleted)
   FOLLOW UP  Date of Service/Encounter:  11/09/24   Assessment:   No diagnosis found.  Plan/Recommendations:   There are no Patient Instructions on file for this visit.   Subjective:   Andrew Ferguson is a 53 y.o. male presenting today for follow up of No chief complaint on file.   Andrew Ferguson has a history of the following: Patient Active Problem List   Diagnosis Date Noted   Prostate cancer screening 07/02/2024   Asthma 01/03/2024   Encounter for well adult exam with abnormal findings 01/03/2024   Prediabetes 01/03/2024   Colon cancer screening 01/03/2024   Alcohol abuse 10/20/2023   Panic disorder 10/12/2023   Seasonal and perennial allergic rhinitis 09/09/2023   Allergy  to alpha-gal 09/09/2023   Hymenoptera allergy  09/09/2023   Allergic urticaria 09/09/2023   Essential hypertension 05/25/2023   Tobacco use 05/25/2023   Pharyngeal dysphagia 02/28/2023    History obtained from: chart review and {Persons; PED relatives w/patient:19415::patient}.  Discussed the use of AI scribe software for clinical note transcription with the patient and/or guardian, who gave verbal consent to proceed.  Andrew Ferguson is a 53 y.o. male presenting for {Blank single:19197::a food challenge,a drug challenge,skin testing,a sick visit,an evaluation of ***,a follow up visit}.  Asthma/Respiratory Symptom History: ***  Allergic Rhinitis Symptom History: ***  Food Allergy  Symptom History: ***  Skin Symptom History: ***  GERD Symptom History: ***  Infection Symptom History: ***  Otherwise, there have been no changes to his past medical history, surgical history, family history, or social history.    Review of systems otherwise negative other than that mentioned in the HPI.    Objective:   There were no vitals taken for this visit. There is no height or weight on file to calculate BMI.    Physical Exam   Diagnostic studies: {Blank single:19197::none,deferred  due to recent antihistamine use,deferred due to insurance stipulations that require a separate visit for testing,labs sent instead, }  Spirometry: {Blank single:19197::results normal (FEV1: ***%, FVC: ***%, FEV1/FVC: ***%),results abnormal (FEV1: ***%, FVC: ***%, FEV1/FVC: ***%)}.    {Blank single:19197::Spirometry consistent with mild obstructive disease,Spirometry consistent with moderate obstructive disease,Spirometry consistent with severe obstructive disease,Spirometry consistent with possible restrictive disease,Spirometry consistent with mixed obstructive and restrictive disease,Spirometry uninterpretable due to technique,Spirometry consistent with normal pattern}. {Blank single:19197::Albuterol /Atrovent nebulizer,Xopenex/Atrovent nebulizer,Albuterol  nebulizer,Albuterol  four puffs via MDI,Xopenex four puffs via MDI} treatment given in clinic with {Blank single:19197::significant improvement in FEV1 per ATS criteria,significant improvement in FVC per ATS criteria,significant improvement in FEV1 and FVC per ATS criteria,improvement in FEV1, but not significant per ATS criteria,improvement in FVC, but not significant per ATS criteria,improvement in FEV1 and FVC, but not significant per ATS criteria,no improvement}.  Allergy  Studies: {Blank single:19197::none,deferred due to recent antihistamine use,deferred due to insurance stipulations that require a separate visit for testing,labs sent instead, }    {Blank single:19197::Allergy  testing results were read and interpreted by myself, documented by clinical staff., }      Marty Shaggy, MD  [facility]

## 2024-11-29 ENCOUNTER — Ambulatory Visit (HOSPITAL_BASED_OUTPATIENT_CLINIC_OR_DEPARTMENT_OTHER): Admitting: Pulmonary Disease

## 2025-01-04 ENCOUNTER — Ambulatory Visit (INDEPENDENT_AMBULATORY_CARE_PROVIDER_SITE_OTHER): Admitting: Internal Medicine

## 2025-01-04 ENCOUNTER — Encounter: Payer: Self-pay | Admitting: Internal Medicine

## 2025-01-04 VITALS — BP 116/85 | HR 80 | Resp 12 | Ht 70.0 in | Wt 176.0 lb

## 2025-01-04 DIAGNOSIS — I10 Essential (primary) hypertension: Secondary | ICD-10-CM | POA: Diagnosis not present

## 2025-01-04 DIAGNOSIS — Z0001 Encounter for general adult medical examination with abnormal findings: Secondary | ICD-10-CM

## 2025-01-04 DIAGNOSIS — Z1211 Encounter for screening for malignant neoplasm of colon: Secondary | ICD-10-CM | POA: Diagnosis not present

## 2025-01-04 DIAGNOSIS — Z91018 Allergy to other foods: Secondary | ICD-10-CM | POA: Diagnosis not present

## 2025-01-04 DIAGNOSIS — J4520 Mild intermittent asthma, uncomplicated: Secondary | ICD-10-CM | POA: Diagnosis not present

## 2025-01-04 NOTE — Assessment & Plan Note (Addendum)
 Physical exam as documented. Fasting blood tests today. Advised to get Shingrix and pneumococcal vaccines, but he denies today.

## 2025-01-04 NOTE — Assessment & Plan Note (Signed)
 Has been avoiding red meat Followed by allergy  and immunology clinic

## 2025-01-04 NOTE — Patient Instructions (Signed)
 Please continue to take medications as prescribed.  Please continue to follow low salt diet and perform moderate exercise/walking at least 150 mins/week.  Please consider getting Shingrix and Pneumococcal vaccines.

## 2025-01-04 NOTE — Progress Notes (Signed)
 "  Established Patient Office Visit  Subjective:  Patient ID: Andrew Ferguson, male    DOB: [birth year]  Age: 54 y.o. MRN: [id number]  CC:  Chief Complaint  Patient presents with   Annual Exam   Hypertension    HPI Andrew Ferguson is a 54 y.o. male with past medical history of HTN, allergic rhinitis and tobacco use who presents for f/u of his chronic medical conditions.  HTN: BP is well-controlled. Takes medications regularly. Patient denies headache, dizziness, chest pain, dyspnea or palpitations.  Allergic rhinitis and allergy  to Alpha-gal: Followed by allergy  clinic.  He keeps EpiPen  for history of anaphylactic reaction.  He also has albuterol  inhaler for as needed use, but rarely needs it.  Past Medical History:  Diagnosis Date   Allergy     Allergy  to alpha-gal    Anaphylactic shock due to adverse food reaction 07/19/2023   Hypertension     Past Surgical History:  Procedure Laterality Date   TONSILLECTOMY     TYMPANOSTOMY TUBE PLACEMENT Bilateral     Family History  Problem Relation Age of Onset   Diabetes Mother    Hypertension Father    Stroke Father    Kidney disease Father    Allergic rhinitis Neg Hx    Angioedema Neg Hx    Asthma Neg Hx    Eczema Neg Hx     Social History   Socioeconomic History   Marital status: Married    Spouse name: Not on file   Number of children: Not on file   Years of education: Not on file   Highest education level: Associate degree: occupational, scientist, product/process development, or vocational program  Occupational History   Not on file  Tobacco Use   Smoking status: Some Days    Current packs/day: 1.00    Average packs/day: 1 pack/day for 15.0 years (15.0 ttl pk-yrs)    Types: Cigarettes   Smokeless tobacco: Current    Types: Chew   Tobacco comments:    Smokeless tobacco dip in lip  Vaping Use   Vaping status: Never Used  Substance and Sexual Activity   Alcohol use: Yes    Alcohol/week: 24.0 standard drinks of alcohol    Types: 24 Cans of  beer per week    Comment: 4 a day   Drug use: Never   Sexual activity: Yes    Birth control/protection: None  Other Topics Concern   Not on file  Social History Narrative   Not on file   Social Drivers of Health   Tobacco Use: High Risk (01/04/2025)   Patient History    Smoking Tobacco Use: Some Days    Smokeless Tobacco Use: Current    Passive Exposure: Not on file  Financial Resource Strain: Low Risk (01/03/2025)   Overall Financial Resource Strain (CARDIA)    Difficulty of Paying Living Expenses: Not hard at all  Food Insecurity: No Food Insecurity (01/03/2025)   Epic    Worried About Radiation Protection Practitioner of Food in the Last Year: Never true    Ran Out of Food in the Last Year: Never true  Transportation Needs: No Transportation Needs (01/03/2025)   Epic    Lack of Transportation (Medical): No    Lack of Transportation (Non-Medical): No  Physical Activity: Sufficiently Active (01/03/2025)   Exercise Vital Sign    Days of Exercise per Week: 7 days    Minutes of Exercise per Session: 40 min  Stress: No Stress Concern Present (01/03/2025)   [employer]  of Occupational Health - Occupational Stress Questionnaire    Feeling of Stress: Not at all  Social Connections: Moderately Integrated (01/03/2025)   Social Connection and Isolation Panel    Frequency of Communication with Friends and Family: More than three times a week    Frequency of Social Gatherings with Friends and Family: More than three times a week    Attends Religious Services: More than 4 times per year    Active Member of Clubs or Organizations: No    Attends Banker Meetings: Not on file    Marital Status: Married  Intimate Partner Violence: Not At Risk (01/04/2025)   Epic    Fear of Current or Ex-Partner: No    Emotionally Abused: No    Physically Abused: No    Sexually Abused: No  Depression (PHQ2-9): Low Risk (01/04/2025)   Depression (PHQ2-9)    PHQ-2 Score: 0  Alcohol Screen: Medium Risk (01/03/2025)    Alcohol Screen    Last Alcohol Screening Score (AUDIT): 10  Housing: Low Risk (01/03/2025)   Epic    Unable to Pay for Housing in the Last Year: No    Number of Times Moved in the Last Year: 0    Homeless in the Last Year: No  Utilities: Not At Risk (01/04/2025)   Epic    Threatened with loss of utilities: No  Health Literacy: Adequate Health Literacy (01/04/2025)   B1300 Health Literacy    Frequency of need for help with medical instructions: Never    Outpatient Medications Prior to Visit  Medication Sig Dispense Refill   albuterol  (VENTOLIN  HFA) 108 (90 Base) MCG/ACT inhaler INHALE 2 PUFFS BY MOUTH EVERY 4 HOURS 6.7 each 2   amLODipine  (NORVASC ) 5 MG tablet Take 1 tablet (5 mg total) by mouth daily. 90 tablet 3   EPINEPHRINE  0.3 mg/0.3 mL IJ SOAJ injection INJECT 0.3 MG INTO THE MUSCLE AS NEEDED FOR ANAPHYLAXIS. 6 each 0   nebivolol  (BYSTOLIC ) 5 MG tablet TAKE 1 TABLET (5 MG TOTAL) BY MOUTH DAILY. 90 tablet 3   No facility-administered medications prior to visit.    Allergies  Allergen Reactions   Alpha-Gal    Carrot Oil Hives   Codeine Swelling   Penicillins Swelling    ROS Review of Systems  Constitutional:  Negative for chills and fever.  HENT:  Negative for congestion and sore throat.   Eyes:  Negative for pain and discharge.  Respiratory:  Negative for cough and shortness of breath.   Cardiovascular:  Negative for chest pain and palpitations.  Gastrointestinal:  Negative for constipation, diarrhea, nausea and vomiting.  Endocrine: Negative for polydipsia and polyuria.  Genitourinary:  Negative for dysuria and hematuria.  Musculoskeletal:  Negative for neck pain and neck stiffness.  Skin:  Negative for rash.  Neurological:  Negative for dizziness, weakness, numbness and headaches.  Psychiatric/Behavioral:  Negative for agitation and behavioral problems.       Objective:    Physical Exam Vitals reviewed.  Constitutional:      General: He is not in acute  distress.    Appearance: He is not diaphoretic.  HENT:     Head: Normocephalic and atraumatic.     Nose: Nose normal.     Mouth/Throat:     Mouth: Mucous membranes are moist.  Eyes:     General: No scleral icterus.    Extraocular Movements: Extraocular movements intact.  Cardiovascular:     Rate and Rhythm: Normal rate and regular rhythm.  Heart sounds: No murmur heard. Pulmonary:     Breath sounds: Normal breath sounds. No wheezing or rales.  Abdominal:     Palpations: Abdomen is soft.     Tenderness: There is no abdominal tenderness.  Musculoskeletal:     Cervical back: Neck supple. No tenderness.     Right lower leg: No edema.     Left lower leg: No edema.  Skin:    General: Skin is warm.     Findings: No rash.  Neurological:     General: No focal deficit present.     Mental Status: He is alert and oriented to person, place, and time.     Cranial Nerves: No cranial nerve deficit.     Sensory: No sensory deficit.     Motor: No weakness.  Psychiatric:        Mood and Affect: Mood normal.        Behavior: Behavior normal.     BP 116/85   Pulse 80   Resp 12   Ht 5' 10 (1.778 m)   Wt 176 lb (79.8 kg)   BMI 25.25 kg/m  Wt Readings from Last 3 Encounters:  01/04/25 176 lb (79.8 kg)  11/09/24 174 lb 9.6 oz (79.2 kg)  07/02/24 172 lb 6.4 oz (78.2 kg)    Lab Results  Component Value Date   TSH 1.520 01/03/2024   Lab Results  Component Value Date   WBC 4.2 01/03/2024   HGB 16.0 01/03/2024   HCT 46.1 01/03/2024   MCV 99 (H) 01/03/2024   PLT 242 01/03/2024   Lab Results  Component Value Date   NA 139 01/03/2024   K 4.5 01/03/2024   CO2 23 01/03/2024   GLUCOSE 89 01/03/2024   BUN 10 01/03/2024   CREATININE 0.86 01/03/2024   BILITOT 1.3 (H) 01/03/2024   ALKPHOS 79 01/03/2024   AST 24 01/03/2024   ALT 26 01/03/2024   PROT 6.6 01/03/2024   ALBUMIN 4.7 01/03/2024   CALCIUM 10.0 01/03/2024   EGFR 103 01/03/2024   Lab Results  Component Value Date    CHOL 206 (H) 01/03/2024   Lab Results  Component Value Date   HDL 82 01/03/2024   Lab Results  Component Value Date   LDLCALC 107 (H) 01/03/2024   Lab Results  Component Value Date   TRIG 95 01/03/2024   Lab Results  Component Value Date   CHOLHDL 2.5 01/03/2024   Lab Results  Component Value Date   HGBA1C 5.9 (H) 01/03/2024      Assessment & Plan:   Problem List Items Addressed This Visit       Cardiovascular and Mediastinum   Essential hypertension (Chronic)   BP Readings from Last 1 Encounters:  01/04/25 116/85   Well-controlled with Amlodipine  5 mg QD and Bystolic  5 mg QD Counseled for compliance with the medications Advised DASH diet and moderate exercise/walking, at least 150 mins/week         Respiratory   Asthma   Overall well-controlled with albuterol  as needed        Other   Encounter for general adult medical examination with abnormal findings - Primary   Physical exam as documented. Fasting blood tests today. Advised to get Shingrix and pneumococcal vaccines, but he denies today.      Allergy  to alpha-gal   Has been avoiding red meat Followed by allergy  and immunology clinic      Colon cancer screening   Relevant Orders   Cologuard  No orders of the defined types were placed in this encounter.   Follow-up: Return in about 6 months (around 07/04/2025) for HTN.    Suzzane MARLA Blanch, MD "

## 2025-01-04 NOTE — Assessment & Plan Note (Signed)
 Overall well-controlled with albuterol  as needed

## 2025-01-04 NOTE — Assessment & Plan Note (Signed)
 BP Readings from Last 1 Encounters:  01/04/25 116/85   Well-controlled with Amlodipine  5 mg QD and Bystolic  5 mg QD Counseled for compliance with the medications Advised DASH diet and moderate exercise/walking, at least 150 mins/week

## 2025-01-05 ENCOUNTER — Ambulatory Visit: Payer: Self-pay | Admitting: Internal Medicine

## 2025-01-05 LAB — LIPID PANEL
Chol/HDL Ratio: 2.8 ratio (ref 0.0–5.0)
Cholesterol, Total: 188 mg/dL (ref 100–199)
HDL: 67 mg/dL
LDL Chol Calc (NIH): 109 mg/dL — ABNORMAL HIGH (ref 0–99)
Triglycerides: 63 mg/dL (ref 0–149)
VLDL Cholesterol Cal: 12 mg/dL (ref 5–40)

## 2025-01-05 LAB — CMP14+EGFR
ALT: 28 IU/L (ref 0–44)
AST: 19 IU/L (ref 0–40)
Albumin: 4.6 g/dL (ref 3.8–4.9)
Alkaline Phosphatase: 74 IU/L (ref 47–123)
BUN/Creatinine Ratio: 16 (ref 9–20)
BUN: 12 mg/dL (ref 6–24)
Bilirubin Total: 0.9 mg/dL (ref 0.0–1.2)
CO2: 22 mmol/L (ref 20–29)
Calcium: 10 mg/dL (ref 8.7–10.2)
Chloride: 100 mmol/L (ref 96–106)
Creatinine, Ser: 0.76 mg/dL (ref 0.76–1.27)
Globulin, Total: 1.9 g/dL (ref 1.5–4.5)
Glucose: 101 mg/dL — ABNORMAL HIGH (ref 70–99)
Potassium: 4.7 mmol/L (ref 3.5–5.2)
Sodium: 140 mmol/L (ref 134–144)
Total Protein: 6.5 g/dL (ref 6.0–8.5)
eGFR: 106 mL/min/1.73

## 2025-01-05 LAB — CBC WITH DIFFERENTIAL/PLATELET
Basophils Absolute: 0.1 x10E3/uL (ref 0.0–0.2)
Basos: 1 %
EOS (ABSOLUTE): 0.1 x10E3/uL (ref 0.0–0.4)
Eos: 3 %
Hematocrit: 49.6 % (ref 37.5–51.0)
Hemoglobin: 16.4 g/dL (ref 13.0–17.7)
Immature Grans (Abs): 0 x10E3/uL (ref 0.0–0.1)
Immature Granulocytes: 0 %
Lymphocytes Absolute: 1.4 x10E3/uL (ref 0.7–3.1)
Lymphs: 33 %
MCH: 33.3 pg — ABNORMAL HIGH (ref 26.6–33.0)
MCHC: 33.1 g/dL (ref 31.5–35.7)
MCV: 101 fL — ABNORMAL HIGH (ref 79–97)
Monocytes Absolute: 0.5 x10E3/uL (ref 0.1–0.9)
Monocytes: 13 %
Neutrophils Absolute: 2.1 x10E3/uL (ref 1.4–7.0)
Neutrophils: 50 %
Platelets: 231 x10E3/uL (ref 150–450)
RBC: 4.92 x10E6/uL (ref 4.14–5.80)
RDW: 12.4 % (ref 11.6–15.4)
WBC: 4.2 x10E3/uL (ref 3.4–10.8)

## 2025-01-05 LAB — HEMOGLOBIN A1C
Est. average glucose Bld gHb Est-mCnc: 117 mg/dL
Hgb A1c MFr Bld: 5.7 % — ABNORMAL HIGH (ref 4.8–5.6)

## 2025-01-05 LAB — TSH: TSH: 1.67 u[IU]/mL (ref 0.450–4.500)

## 2025-01-05 LAB — VITAMIN D 25 HYDROXY (VIT D DEFICIENCY, FRACTURES): Vit D, 25-Hydroxy: 19.2 ng/mL — ABNORMAL LOW (ref 30.0–100.0)

## 2025-01-05 LAB — PSA: Prostate Specific Ag, Serum: 3.2 ng/mL (ref 0.0–4.0)

## 2025-02-07 ENCOUNTER — Ambulatory Visit (HOSPITAL_BASED_OUTPATIENT_CLINIC_OR_DEPARTMENT_OTHER): Admitting: Pulmonary Disease

## 2025-05-15 ENCOUNTER — Ambulatory Visit: Admitting: Allergy & Immunology

## 2025-07-19 ENCOUNTER — Ambulatory Visit: Payer: Self-pay | Admitting: Internal Medicine
